# Patient Record
Sex: Male | Born: 1935 | Race: White | Hispanic: No | Marital: Married | Smoking: Former smoker
Health system: Southern US, Community
[De-identification: ages and names within clinical notes are randomized; demographics above are authoritative.]

## PROBLEM LIST (undated history)

## (undated) DIAGNOSIS — J189 Pneumonia, unspecified organism: Secondary | ICD-10-CM

## (undated) DIAGNOSIS — H9190 Unspecified hearing loss, unspecified ear: Secondary | ICD-10-CM

## (undated) DIAGNOSIS — E119 Type 2 diabetes mellitus without complications: Secondary | ICD-10-CM

## (undated) DIAGNOSIS — G4730 Sleep apnea, unspecified: Secondary | ICD-10-CM

## (undated) DIAGNOSIS — N400 Enlarged prostate without lower urinary tract symptoms: Secondary | ICD-10-CM

## (undated) DIAGNOSIS — F419 Anxiety disorder, unspecified: Secondary | ICD-10-CM

## (undated) DIAGNOSIS — F329 Major depressive disorder, single episode, unspecified: Secondary | ICD-10-CM

## (undated) DIAGNOSIS — F32A Depression, unspecified: Secondary | ICD-10-CM

## (undated) DIAGNOSIS — E7800 Pure hypercholesterolemia, unspecified: Secondary | ICD-10-CM

## (undated) DIAGNOSIS — I10 Essential (primary) hypertension: Secondary | ICD-10-CM

## (undated) DIAGNOSIS — C801 Malignant (primary) neoplasm, unspecified: Secondary | ICD-10-CM

## (undated) DIAGNOSIS — Z91018 Allergy to other foods: Secondary | ICD-10-CM

## (undated) HISTORY — DX: Allergy to other foods: Z91.018

## (undated) HISTORY — PX: VASECTOMY: SHX75

## (undated) HISTORY — PX: EYE SURGERY: SHX253

## (undated) HISTORY — DX: Sleep apnea, unspecified: G47.30

## (undated) HISTORY — DX: Pneumonia, unspecified organism: J18.9

## (undated) HISTORY — PX: TONSILLECTOMY: SUR1361

---

## 1997-05-22 ENCOUNTER — Other Ambulatory Visit: Admission: RE | Admit: 1997-05-22 | Discharge: 1997-05-22 | Payer: Self-pay | Admitting: Family Medicine

## 2002-11-23 ENCOUNTER — Ambulatory Visit (HOSPITAL_COMMUNITY): Admission: RE | Admit: 2002-11-23 | Discharge: 2002-11-23 | Payer: Self-pay | Admitting: Gastroenterology

## 2004-04-27 ENCOUNTER — Emergency Department (HOSPITAL_COMMUNITY): Admission: EM | Admit: 2004-04-27 | Discharge: 2004-04-28 | Payer: Self-pay | Admitting: Emergency Medicine

## 2012-06-08 ENCOUNTER — Encounter (HOSPITAL_COMMUNITY): Payer: Self-pay | Admitting: Emergency Medicine

## 2012-06-08 ENCOUNTER — Emergency Department (HOSPITAL_COMMUNITY)
Admission: EM | Admit: 2012-06-08 | Discharge: 2012-06-08 | Disposition: A | Discharge status: Home / Self Care | Payer: Medicare Other | Attending: Emergency Medicine | Admitting: Emergency Medicine

## 2012-06-08 ENCOUNTER — Emergency Department (HOSPITAL_COMMUNITY): Payer: Medicare Other

## 2012-06-08 DIAGNOSIS — T7840XA Allergy, unspecified, initial encounter: Secondary | ICD-10-CM

## 2012-06-08 DIAGNOSIS — R1084 Generalized abdominal pain: Secondary | ICD-10-CM | POA: Insufficient documentation

## 2012-06-08 DIAGNOSIS — R61 Generalized hyperhidrosis: Secondary | ICD-10-CM | POA: Insufficient documentation

## 2012-06-08 DIAGNOSIS — E119 Type 2 diabetes mellitus without complications: Secondary | ICD-10-CM | POA: Insufficient documentation

## 2012-06-08 DIAGNOSIS — R109 Unspecified abdominal pain: Secondary | ICD-10-CM

## 2012-06-08 DIAGNOSIS — Z87891 Personal history of nicotine dependence: Secondary | ICD-10-CM | POA: Insufficient documentation

## 2012-06-08 HISTORY — DX: Type 2 diabetes mellitus without complications: E11.9

## 2012-06-08 LAB — CBC WITH DIFFERENTIAL/PLATELET
Basophils Absolute: 0 10*3/uL (ref 0.0–0.1)
Basophils Relative: 0 % (ref 0–1)
Eosinophils Absolute: 0.1 10*3/uL (ref 0.0–0.7)
Eosinophils Relative: 1 % (ref 0–5)
HCT: 40.9 % (ref 39.0–52.0)
Hemoglobin: 14.3 g/dL (ref 13.0–17.0)
Lymphocytes Relative: 10 % — ABNORMAL LOW (ref 12–46)
Lymphs Abs: 1.3 10*3/uL (ref 0.7–4.0)
MCH: 28.2 pg (ref 26.0–34.0)
MCHC: 35 g/dL (ref 30.0–36.0)
MCV: 80.7 fL (ref 78.0–100.0)
Monocytes Absolute: 0.5 10*3/uL (ref 0.1–1.0)
Monocytes Relative: 4 % (ref 3–12)
Neutro Abs: 11.2 10*3/uL — ABNORMAL HIGH (ref 1.7–7.7)
Neutrophils Relative %: 86 % — ABNORMAL HIGH (ref 43–77)
Platelets: 164 10*3/uL (ref 150–400)
RBC: 5.07 MIL/uL (ref 4.22–5.81)
RDW: 12.6 % (ref 11.5–15.5)
WBC: 13 10*3/uL — ABNORMAL HIGH (ref 4.0–10.5)

## 2012-06-08 LAB — COMPREHENSIVE METABOLIC PANEL
ALT: 14 U/L (ref 0–53)
AST: 13 U/L (ref 0–37)
Albumin: 3.5 g/dL (ref 3.5–5.2)
Alkaline Phosphatase: 62 U/L (ref 39–117)
BUN: 18 mg/dL (ref 6–23)
CO2: 26 mEq/L (ref 19–32)
Calcium: 8.9 mg/dL (ref 8.4–10.5)
Chloride: 100 mEq/L (ref 96–112)
Creatinine, Ser: 0.9 mg/dL (ref 0.50–1.35)
GFR calc Af Amer: >90 mL/min (ref >90)
GFR calc non Af Amer: 81 mL/min — ABNORMAL LOW (ref >90)
Glucose, Bld: 334 mg/dL — ABNORMAL HIGH (ref 70–99)
Potassium: 3.8 mEq/L (ref 3.5–5.1)
Sodium: 137 mEq/L (ref 135–145)
Total Bilirubin: 0.3 mg/dL (ref 0.3–1.2)
Total Protein: 6.1 g/dL (ref 6.0–8.3)

## 2012-06-08 LAB — TROPONIN I
Troponin I: <0.3 ng/mL (ref <0.30)
Troponin I: <0.3 ng/mL (ref <0.30)

## 2012-06-08 LAB — LIPASE, BLOOD: Lipase: 41 U/L (ref 11–59)

## 2012-06-08 MED ORDER — SODIUM CHLORIDE 0.9 % IV BOLUS (SEPSIS)
1000.0000 mL | Freq: Once | INTRAVENOUS | Status: AC
  Administered 2012-06-08: 1000 mL via INTRAVENOUS

## 2012-06-08 MED ORDER — DIPHENHYDRAMINE HCL 50 MG/ML IJ SOLN
25.0000 mg | Freq: Once | INTRAMUSCULAR | Status: AC
  Administered 2012-06-08: 25 mg via INTRAVENOUS
  Filled 2012-06-08: qty 1

## 2012-06-08 MED ORDER — EPINEPHRINE 0.3 MG/0.3ML IJ SOAJ: 0.3000 mg | Freq: Once | INTRAMUSCULAR | Status: DC

## 2012-06-08 NOTE — ED Notes (Signed)
ZOX:[id number]<AV> Expected date:<BR> Expected time:<BR> Means of arrival:<BR> Comments:<BR> Abdominal pain

## 2012-06-08 NOTE — ED Provider Notes (Signed)
History     CSN: [id number]  Arrival date & time 06/08/12  1743   First MD Initiated Contact with Patient 06/08/12 1752      Chief Complaint  Patient presents with  . Abdominal Pain    (Consider location/radiation/quality/duration/timing/severity/associated sxs/prior treatment) The history is provided by the patient.  Nathaniel Gray is a 77 y.o. male history of diabetes, who presenting with abdominal pain. He ate working for lunch and arouse later had some abdominal pain. He was trying to have a bowel movement and then had diffuse abdominal pain he also became flushed and he felt that his throat was closing off. She also reported diaphoresis at the time but denies any chest pain or vomiting. He thought he was having an allergic reaction but didn't take any medicine or unusual food prior to that. No cardiac history. He went to urgent care was given sublingual nitroglycerin and GI cocktail and sent here for evaluation. His CBG at that time was 415.    Past Medical History  Diagnosis Date  . Diabetes mellitus without complication     Past Surgical History  Procedure Laterality Date  . Vasectomy      No family history on file.  History  Substance Use Topics  . Smoking status: Former Smoker -- 2.00 packs/day for 35 years    Types: Cigarettes    Quit date: 01/19/1989  . Smokeless tobacco: Never Used  . Alcohol Use: Yes     Comment: Occassionally       Review of Systems  Gastrointestinal: Positive for abdominal pain.  All other systems reviewed and are negative.    Allergies  Review of patient's allergies indicates no known allergies.  Home Medications  No current outpatient prescriptions on file.  BP 145/66  Pulse 87  Temp(Src) 98.8 F (37.1 C) (Oral)  Resp 18  SpO2 97%  Physical Exam  Nursing note and vitals reviewed. Constitutional: He is oriented to person, place, and time. He appears well-developed and well-nourished.  Comfortable   HENT:  Head:  Normocephalic.  Mouth/Throat: Oropharynx is clear and moist.  OP not swollen, uvula midline   Eyes: Conjunctivae are normal. Pupils are equal, round, and reactive to light.  Neck: Normal range of motion. Neck supple.  Cardiovascular: Normal rate, regular rhythm and normal heart sounds.   Pulmonary/Chest: Effort normal and breath sounds normal. No respiratory distress. He has no wheezes. He has no rales.  Abdominal: Soft. Bowel sounds are normal.  Obese, nontender abdomen   Musculoskeletal: Normal range of motion.  Neurological: He is alert and oriented to person, place, and time.  Skin: Skin is warm and dry. No erythema.  Psychiatric: He has a normal mood and affect. His behavior is normal. Judgment and thought content normal.    ED Course  Procedures (including critical care time)  Labs Reviewed  CBC WITH DIFFERENTIAL - Abnormal; Notable for the following:    WBC 13.0 (*)    Neutrophils Relative % 86 (*)    Neutro Abs 11.2 (*)    Lymphocytes Relative 10 (*)    All other components within normal limits  COMPREHENSIVE METABOLIC PANEL - Abnormal; Notable for the following:    Glucose, Bld 334 (*)    GFR calc non Af Amer 81 (*)    All other components within normal limits  LIPASE, BLOOD  TROPONIN I  TROPONIN I   Dg Abd Acute W/chest  06/08/2012   *RADIOLOGY REPORT*  Clinical Data: Shortness of breath, abdominal pain,  nausea/vomiting  ACUTE ABDOMEN SERIES (ABDOMEN 2 VIEW & CHEST 1 VIEW)  Comparison: None.  Findings: Mild patchy bibasilar opacities, likely atelectasis. No pleural effusion or pneumothorax.  Mild cardiomegaly.  Nonobstructive bowel gas pattern.  No evidence of free air under the diaphragm on the upright view.  Surgical clips in the left mid abdomen.  Degenerative changes of the visualized thoracolumbar spine.  IMPRESSION: Mild patchy bibasilar opacities, likely atelectasis.  No evidence of small bowel obstruction or free air.   Original Report Authenticated By: Charline Bills, M.D.     No diagnosis found.   Date: 06/08/2012  Rate: 81  Rhythm: normal sinus rhythm  QRS Axis: normal  Intervals: normal  ST/T Wave abnormalities: nonspecific ST changes  Conduction Disutrbances:left anterior fascicular block  Narrative Interpretation:   Old EKG Reviewed: none available    MDM  Nathaniel Gray is a 77 y.o. male here with ab pain, diaphoresis. No obvious rash to suggest allergic reaction. Consider vagal response vs atypical ACS. Will get labs, trop x 2.   10:30 PM Trop neg x 2 low risk for ACS. Xray ab with chest unremarkable. Throat not swelling up again. Tolerated PO well. CBG 334 but no gap. I recommend prn benadryl. No need for steroids for now (it will exacerbate his elevated blood sugar). Will give script for epi pen in case it happens again. Recommend outpatient allergy testing.        Richardean Canal, MD 06/08/12 2232

## 2012-06-08 NOTE — ED Notes (Addendum)
Per EMS: Pt c/o abdominal pain. Pt was at work and walked to a urgent care. Pt reports trying to have a bowel movement and reports becoming flush and "his throat was closing off." Urgent care could not obtain a blood pressure and gave 0.4 mg of nitro sublingual and a GI cocktail. Urgent care started a 22g IV was given 700 ml of D5W. Upon EMS arrival pt denied feelings of his throat closing off or shortness of breath, however still reported abdominal pain and nausea, CBG was 415.

## 2014-05-11 ENCOUNTER — Other Ambulatory Visit: Payer: Self-pay | Admitting: Family Medicine

## 2014-05-11 ENCOUNTER — Ambulatory Visit
Admission: RE | Admit: 2014-05-11 | Discharge: 2014-05-11 | Disposition: A | Discharge status: Home / Self Care | Payer: Self-pay | Source: Ambulatory Visit | Attending: Family Medicine | Admitting: Family Medicine

## 2014-05-11 DIAGNOSIS — M79604 Pain in right leg: Secondary | ICD-10-CM

## 2014-05-11 DIAGNOSIS — M79672 Pain in left foot: Principal | ICD-10-CM

## 2014-05-11 DIAGNOSIS — M79605 Pain in left leg: Principal | ICD-10-CM

## 2014-05-11 DIAGNOSIS — M79671 Pain in right foot: Principal | ICD-10-CM

## 2015-01-08 ENCOUNTER — Other Ambulatory Visit: Payer: Self-pay | Admitting: Neurosurgery

## 2015-01-22 DIAGNOSIS — E7800 Pure hypercholesterolemia, unspecified: Secondary | ICD-10-CM | POA: Diagnosis not present

## 2015-01-22 DIAGNOSIS — E1129 Type 2 diabetes mellitus with other diabetic kidney complication: Secondary | ICD-10-CM | POA: Diagnosis not present

## 2015-01-22 DIAGNOSIS — E538 Deficiency of other specified B group vitamins: Secondary | ICD-10-CM | POA: Diagnosis not present

## 2015-01-24 DIAGNOSIS — E538 Deficiency of other specified B group vitamins: Secondary | ICD-10-CM | POA: Diagnosis not present

## 2015-01-24 DIAGNOSIS — I10 Essential (primary) hypertension: Secondary | ICD-10-CM | POA: Diagnosis not present

## 2015-01-24 DIAGNOSIS — E1129 Type 2 diabetes mellitus with other diabetic kidney complication: Secondary | ICD-10-CM | POA: Diagnosis not present

## 2015-01-24 DIAGNOSIS — E7800 Pure hypercholesterolemia, unspecified: Secondary | ICD-10-CM | POA: Diagnosis not present

## 2015-01-24 DIAGNOSIS — E669 Obesity, unspecified: Secondary | ICD-10-CM | POA: Diagnosis not present

## 2015-02-07 DIAGNOSIS — E162 Hypoglycemia, unspecified: Secondary | ICD-10-CM | POA: Diagnosis not present

## 2015-02-07 DIAGNOSIS — M5116 Intervertebral disc disorders with radiculopathy, lumbar region: Secondary | ICD-10-CM | POA: Diagnosis not present

## 2015-02-13 DIAGNOSIS — H35371 Puckering of macula, right eye: Secondary | ICD-10-CM | POA: Diagnosis not present

## 2015-02-13 DIAGNOSIS — H547 Unspecified visual loss: Secondary | ICD-10-CM | POA: Diagnosis not present

## 2015-02-21 DIAGNOSIS — H35371 Puckering of macula, right eye: Secondary | ICD-10-CM | POA: Diagnosis not present

## 2015-02-25 ENCOUNTER — Encounter (HOSPITAL_COMMUNITY)
Admission: RE | Admit: 2015-02-25 | Discharge: 2015-02-25 | Disposition: A | Discharge status: Home / Self Care | Payer: Medicare Other | Source: Ambulatory Visit | Attending: Neurosurgery | Admitting: Neurosurgery

## 2015-02-25 ENCOUNTER — Encounter (HOSPITAL_COMMUNITY): Payer: Self-pay

## 2015-02-25 DIAGNOSIS — Z01812 Encounter for preprocedural laboratory examination: Secondary | ICD-10-CM | POA: Diagnosis not present

## 2015-02-25 DIAGNOSIS — R9431 Abnormal electrocardiogram [ECG] [EKG]: Secondary | ICD-10-CM | POA: Diagnosis not present

## 2015-02-25 DIAGNOSIS — M4806 Spinal stenosis, lumbar region: Secondary | ICD-10-CM | POA: Insufficient documentation

## 2015-02-25 DIAGNOSIS — Z01818 Encounter for other preprocedural examination: Secondary | ICD-10-CM | POA: Diagnosis not present

## 2015-02-25 HISTORY — DX: Major depressive disorder, single episode, unspecified: F32.9

## 2015-02-25 HISTORY — DX: Benign prostatic hyperplasia without lower urinary tract symptoms: N40.0

## 2015-02-25 HISTORY — DX: Unspecified hearing loss, unspecified ear: H91.90

## 2015-02-25 HISTORY — DX: Pure hypercholesterolemia, unspecified: E78.00

## 2015-02-25 HISTORY — DX: Malignant (primary) neoplasm, unspecified: C80.1

## 2015-02-25 HISTORY — DX: Essential (primary) hypertension: I10

## 2015-02-25 HISTORY — DX: Depression, unspecified: F32.A

## 2015-02-25 HISTORY — DX: Anxiety disorder, unspecified: F41.9

## 2015-02-25 LAB — CBC
HCT: 39.7 % (ref 39.0–52.0)
Hemoglobin: 13.7 g/dL (ref 13.0–17.0)
MCH: 29 pg (ref 26.0–34.0)
MCHC: 34.5 g/dL (ref 30.0–36.0)
MCV: 83.9 fL (ref 78.0–100.0)
Platelets: 150 10*3/uL (ref 150–400)
RBC: 4.73 MIL/uL (ref 4.22–5.81)
RDW: 13 % (ref 11.5–15.5)
WBC: 6.3 10*3/uL (ref 4.0–10.5)

## 2015-02-25 LAB — BASIC METABOLIC PANEL
Anion gap: 13 (ref 5–15)
BUN: 20 mg/dL (ref 6–20)
CO2: 27 mmol/L (ref 22–32)
Calcium: 9.6 mg/dL (ref 8.9–10.3)
Chloride: 102 mmol/L (ref 101–111)
Creatinine, Ser: 1.15 mg/dL (ref 0.61–1.24)
GFR calc Af Amer: >60 mL/min (ref >60)
GFR calc non Af Amer: 60 mL/min — ABNORMAL LOW (ref >60)
Glucose, Bld: 162 mg/dL — ABNORMAL HIGH (ref 65–99)
Potassium: 4.3 mmol/L (ref 3.5–5.1)
Sodium: 142 mmol/L (ref 135–145)

## 2015-02-25 LAB — SURGICAL PCR SCREEN
MRSA, PCR: NEGATIVE
Staphylococcus aureus: NEGATIVE

## 2015-02-25 LAB — GLUCOSE, CAPILLARY: Glucose-Capillary: 175 mg/dL — ABNORMAL HIGH (ref 65–99)

## 2015-02-25 NOTE — Progress Notes (Addendum)
Patient states that his am sugars range between 100-130.  Have instructed him to only take 52 units of lantus the night before. NO diabetic pills that morning. PCP is Dr. Geoffry Paradise  @ [facility].  They did an HGB A1C in January -- 8.2.  They are sending me a copy of his labs. Denies every seeing a cardiologist and is not having any cardiac issues.

## 2015-02-25 NOTE — Pre-Procedure Instructions (Signed)
Nathaniel Gray  02/25/2015      [facility] [address], [place] - [address] [address] [address] [place] [address] Phone: [phone] Fax: [phone]    Your procedure is scheduled on Monday, February 13th   Report to [facility] Admitting at 9:15 AM             (Surgery time is posted 11:23 am - 2:15 pm)   Call this number if you have problems the morning of surgery:  [phone] How to Manage Your Diabetes Before Surgery   Why is it important to control my blood sugar before and after surgery?   Improving blood sugar levels before and after surgery helps healing and can limit problems.  A way of improving blood sugar control is eating a healthy diet by:  - Eating less sugar and carbohydrates  - Increasing activity/exercise  - Talk with your doctor about reaching your blood sugar goals  High blood sugars (greater than 180 mg/dL) can raise your risk of infections and slow down your recovery so you will need to focus on controlling your diabetes during the weeks before surgery.  Make sure that the doctor who takes care of your diabetes knows about your planned surgery including the date and location.  How do I manage my blood sugars before surgery?   Check your blood sugar at least 4 times a day, 2 days before surgery to make sure that they are not too high or low.   Check your blood sugar the morning of your surgery when you wake up and every 2               hours until you get to the Short-Stay unit.  If your blood sugar is less than 70 mg/dL, you will need to treat for low blood sugar by:  Treat a low blood sugar (less than 70 mg/dL) with 1/2 cup of clear juice (cranberry or apple), 4 glucose tablets, OR glucose gel.  Recheck blood sugar in 15 minutes after treatment (to make sure it is greater than 70 mg/dL).  If blood sugar is not greater than 70 mg/dL on re-check, call [phone] for further instructions.   Report your blood  sugar to the Short-Stay nurse when you get to Short-Stay.  References:  [facility], 2007 "How to Manage your Diabetes Before and After Surgery".  What do I do about my diabetes medications?   Do not take oral diabetes medicines (pills) the morning of surgery. (NO amaryl/metformin)    THE NIGHT BEFORE SURGERY, take 52 units of  Lantus Insulin.      Remember:  Do not eat food or drink liquids after midnight Sunday.  Take these medicines the morning of surgery with A SIP OF WATER : Xanax, Wellbutrin, Flomax    Do not wear jewelry - no rings or watches.  Do not wear lotions, powders, or perfumes.  You may NOT  wear deodorant the day of surgery.             Men may shave face and neck.  Do not bring valuables to the hospital.  [facility] is not responsible for any belongings or valuables.  Contacts, dentures or bridgework may not be worn into surgery.  Leave your suitcase in the car.  After surgery it may be brought to your room.  For patients admitted to the hospital, discharge time will be determined by your treatment  team.  Patients discharged the day of surgery will not be allowed to drive home.   Name and phone number of your driver:     Please read over the following fact sheets that you were given. Pain Booklet, Coughing and Deep Breathing, MRSA Information and Surgical Site Infection Prevention

## 2015-03-01 MED ORDER — CEFAZOLIN SODIUM-DEXTROSE 2-3 GM-% IV SOLR
2.0000 g | INTRAVENOUS | Status: AC
  Administered 2015-03-04: 2 g via INTRAVENOUS
  Filled 2015-03-01: qty 50

## 2015-03-04 ENCOUNTER — Inpatient Hospital Stay (HOSPITAL_COMMUNITY): Payer: Medicare Other | Admitting: Anesthesiology

## 2015-03-04 ENCOUNTER — Encounter (HOSPITAL_COMMUNITY)
Admission: RE | Disposition: A | Discharge status: Home / Self Care | Payer: Self-pay | Source: Ambulatory Visit | Attending: Neurosurgery

## 2015-03-04 ENCOUNTER — Inpatient Hospital Stay (HOSPITAL_COMMUNITY): Payer: Medicare Other

## 2015-03-04 ENCOUNTER — Encounter (HOSPITAL_COMMUNITY): Payer: Self-pay | Admitting: Certified Registered Nurse Anesthetist

## 2015-03-04 ENCOUNTER — Ambulatory Visit (HOSPITAL_COMMUNITY)
Admission: RE | Admit: 2015-03-04 | Discharge: 2015-03-05 | Disposition: A | Discharge status: Home / Self Care | Payer: Medicare Other | Source: Ambulatory Visit | Attending: Neurosurgery | Admitting: Neurosurgery

## 2015-03-04 DIAGNOSIS — F329 Major depressive disorder, single episode, unspecified: Secondary | ICD-10-CM | POA: Insufficient documentation

## 2015-03-04 DIAGNOSIS — E7800 Pure hypercholesterolemia, unspecified: Secondary | ICD-10-CM | POA: Diagnosis not present

## 2015-03-04 DIAGNOSIS — M47816 Spondylosis without myelopathy or radiculopathy, lumbar region: Secondary | ICD-10-CM | POA: Diagnosis not present

## 2015-03-04 DIAGNOSIS — M5136 Other intervertebral disc degeneration, lumbar region: Secondary | ICD-10-CM | POA: Diagnosis not present

## 2015-03-04 DIAGNOSIS — M48062 Spinal stenosis, lumbar region with neurogenic claudication: Secondary | ICD-10-CM | POA: Diagnosis present

## 2015-03-04 DIAGNOSIS — Z79899 Other long term (current) drug therapy: Secondary | ICD-10-CM | POA: Diagnosis not present

## 2015-03-04 DIAGNOSIS — Z8546 Personal history of malignant neoplasm of prostate: Secondary | ICD-10-CM | POA: Insufficient documentation

## 2015-03-04 DIAGNOSIS — I10 Essential (primary) hypertension: Secondary | ICD-10-CM | POA: Diagnosis not present

## 2015-03-04 DIAGNOSIS — E119 Type 2 diabetes mellitus without complications: Secondary | ICD-10-CM | POA: Insufficient documentation

## 2015-03-04 DIAGNOSIS — E669 Obesity, unspecified: Secondary | ICD-10-CM | POA: Diagnosis not present

## 2015-03-04 DIAGNOSIS — Z87891 Personal history of nicotine dependence: Secondary | ICD-10-CM | POA: Insufficient documentation

## 2015-03-04 DIAGNOSIS — Z419 Encounter for procedure for purposes other than remedying health state, unspecified: Secondary | ICD-10-CM

## 2015-03-04 DIAGNOSIS — Z794 Long term (current) use of insulin: Secondary | ICD-10-CM | POA: Diagnosis not present

## 2015-03-04 DIAGNOSIS — Z7982 Long term (current) use of aspirin: Secondary | ICD-10-CM | POA: Diagnosis not present

## 2015-03-04 DIAGNOSIS — N400 Enlarged prostate without lower urinary tract symptoms: Secondary | ICD-10-CM | POA: Diagnosis not present

## 2015-03-04 DIAGNOSIS — M4806 Spinal stenosis, lumbar region: Secondary | ICD-10-CM | POA: Diagnosis not present

## 2015-03-04 DIAGNOSIS — M4326 Fusion of spine, lumbar region: Secondary | ICD-10-CM | POA: Diagnosis not present

## 2015-03-04 DIAGNOSIS — F419 Anxiety disorder, unspecified: Secondary | ICD-10-CM | POA: Insufficient documentation

## 2015-03-04 DIAGNOSIS — Z6837 Body mass index (BMI) 37.0-37.9, adult: Secondary | ICD-10-CM | POA: Diagnosis not present

## 2015-03-04 HISTORY — PX: LUMBAR LAMINECTOMY/DECOMPRESSION MICRODISCECTOMY: SHX5026

## 2015-03-04 LAB — GLUCOSE, CAPILLARY
Glucose-Capillary: 133 mg/dL — ABNORMAL HIGH (ref 65–99)
Glucose-Capillary: 124 mg/dL — ABNORMAL HIGH (ref 65–99)
Glucose-Capillary: 89 mg/dL (ref 65–99)

## 2015-03-04 SURGERY — LUMBAR LAMINECTOMY/DECOMPRESSION MICRODISCECTOMY 3 LEVELS
Anesthesia: General | Site: Back

## 2015-03-04 MED ORDER — OFLOXACIN 0.3 % OP SOLN
1.0000 [drp] | Freq: Four times a day (QID) | OPHTHALMIC | Status: DC
  Administered 2015-03-04 (×2): 1 [drp] via OPHTHALMIC
  Filled 2015-03-04: qty 5

## 2015-03-04 MED ORDER — ONDANSETRON HCL 4 MG/2ML IJ SOLN: 4.0000 mg | Freq: Once | INTRAMUSCULAR | Status: DC | PRN

## 2015-03-04 MED ORDER — PROPOFOL 10 MG/ML IV BOLUS
INTRAVENOUS | Status: DC | PRN
  Administered 2015-03-04: 130 mg via INTRAVENOUS

## 2015-03-04 MED ORDER — OXYCODONE-ACETAMINOPHEN 5-325 MG PO TABS
1.0000 | ORAL_TABLET | ORAL | Status: DC | PRN
  Administered 2015-03-05: 2 via ORAL
  Filled 2015-03-04: qty 2

## 2015-03-04 MED ORDER — FENTANYL CITRATE (PF) 100 MCG/2ML IJ SOLN
INTRAMUSCULAR | Status: AC
  Filled 2015-03-04: qty 2

## 2015-03-04 MED ORDER — SODIUM CHLORIDE 0.9 % IR SOLN
Status: DC | PRN
  Administered 2015-03-04 (×2)

## 2015-03-04 MED ORDER — BISACODYL 10 MG RE SUPP: 10.0000 mg | Freq: Every day | RECTAL | Status: DC | PRN

## 2015-03-04 MED ORDER — HEMOSTATIC AGENTS (NO CHARGE) OPTIME
TOPICAL | Status: DC | PRN
  Administered 2015-03-04: 1 via TOPICAL

## 2015-03-04 MED ORDER — LIDOCAINE-EPINEPHRINE 1 %-1:100000 IJ SOLN
INTRAMUSCULAR | Status: DC | PRN
  Administered 2015-03-04: 15 mL

## 2015-03-04 MED ORDER — HYDROCHLOROTHIAZIDE 25 MG PO TABS: 25.0000 mg | ORAL_TABLET | Freq: Every day | ORAL | Status: DC

## 2015-03-04 MED ORDER — PHENYLEPHRINE HCL 10 MG/ML IJ SOLN
10.0000 mg | INTRAMUSCULAR | Status: DC | PRN
  Administered 2015-03-04: 15 ug/min via INTRAVENOUS

## 2015-03-04 MED ORDER — ONDANSETRON HCL 4 MG/2ML IJ SOLN
INTRAMUSCULAR | Status: AC
  Filled 2015-03-04: qty 2

## 2015-03-04 MED ORDER — INSULIN ASPART 100 UNIT/ML ~~LOC~~ SOLN: 0.0000 [IU] | Freq: Every day | SUBCUTANEOUS | Status: DC

## 2015-03-04 MED ORDER — ACETAMINOPHEN 650 MG RE SUPP: 650.0000 mg | RECTAL | Status: DC | PRN

## 2015-03-04 MED ORDER — SODIUM CHLORIDE 0.9% FLUSH: 3.0000 mL | INTRAVENOUS | Status: DC | PRN

## 2015-03-04 MED ORDER — PROPOFOL 10 MG/ML IV BOLUS
INTRAVENOUS | Status: AC
  Filled 2015-03-04: qty 20

## 2015-03-04 MED ORDER — SODIUM CHLORIDE 0.9% FLUSH
3.0000 mL | Freq: Two times a day (BID) | INTRAVENOUS | Status: DC
  Administered 2015-03-04: 3 mL via INTRAVENOUS

## 2015-03-04 MED ORDER — LOSARTAN POTASSIUM-HCTZ 100-25 MG PO TABS: 1.0000 | ORAL_TABLET | Freq: Every day | ORAL | Status: DC

## 2015-03-04 MED ORDER — LACTATED RINGERS IV SOLN
INTRAVENOUS | Status: DC
Start: 2015-03-04 — End: 2015-03-05
  Administered 2015-03-04: 10:00:00 via INTRAVENOUS

## 2015-03-04 MED ORDER — 0.9 % SODIUM CHLORIDE (POUR BTL) OPTIME
TOPICAL | Status: DC | PRN
  Administered 2015-03-04 (×2): 1000 mL

## 2015-03-04 MED ORDER — HYDROCODONE-ACETAMINOPHEN 5-325 MG PO TABS: 1.0000 | ORAL_TABLET | ORAL | Status: DC | PRN

## 2015-03-04 MED ORDER — ONDANSETRON HCL 4 MG/2ML IJ SOLN
INTRAMUSCULAR | Status: DC | PRN
  Administered 2015-03-04: 4 mg via INTRAVENOUS

## 2015-03-04 MED ORDER — FENTANYL CITRATE (PF) 250 MCG/5ML IJ SOLN
INTRAMUSCULAR | Status: AC
  Filled 2015-03-04: qty 5

## 2015-03-04 MED ORDER — KETOROLAC TROMETHAMINE 30 MG/ML IJ SOLN
INTRAMUSCULAR | Status: AC
  Filled 2015-03-04: qty 1

## 2015-03-04 MED ORDER — NEOSTIGMINE METHYLSULFATE 10 MG/10ML IV SOLN
INTRAVENOUS | Status: DC | PRN
  Administered 2015-03-04: 5 mg via INTRAVENOUS

## 2015-03-04 MED ORDER — TAMSULOSIN HCL 0.4 MG PO CAPS
0.4000 mg | ORAL_CAPSULE | Freq: Every day | ORAL | Status: DC
  Administered 2015-03-05: 0.4 mg via ORAL
  Filled 2015-03-04: qty 1

## 2015-03-04 MED ORDER — ALUM & MAG HYDROXIDE-SIMETH 200-200-20 MG/5ML PO SUSP
30.0000 mL | Freq: Four times a day (QID) | ORAL | Status: DC | PRN
Start: 2015-03-04 — End: 2015-03-05

## 2015-03-04 MED ORDER — LACTATED RINGERS IV SOLN
INTRAVENOUS | Status: DC | PRN
  Administered 2015-03-04 (×3): via INTRAVENOUS

## 2015-03-04 MED ORDER — HYDROXYZINE HCL 50 MG/ML IM SOLN: 50.0000 mg | INTRAMUSCULAR | Status: DC | PRN

## 2015-03-04 MED ORDER — EPHEDRINE SULFATE 50 MG/ML IJ SOLN
INTRAMUSCULAR | Status: DC | PRN
  Administered 2015-03-04 (×10): 5 mg via INTRAVENOUS

## 2015-03-04 MED ORDER — FENTANYL CITRATE (PF) 100 MCG/2ML IJ SOLN
INTRAMUSCULAR | Status: DC | PRN
  Administered 2015-03-04: 100 ug via INTRAVENOUS

## 2015-03-04 MED ORDER — ROCURONIUM BROMIDE 50 MG/5ML IV SOLN
INTRAVENOUS | Status: AC
  Filled 2015-03-04: qty 1

## 2015-03-04 MED ORDER — PHENOL 1.4 % MT LIQD: 1.0000 | OROMUCOSAL | Status: DC | PRN

## 2015-03-04 MED ORDER — ACETAMINOPHEN 10 MG/ML IV SOLN
INTRAVENOUS | Status: AC
  Administered 2015-03-04: 1000 mg via INTRAVENOUS
  Filled 2015-03-04: qty 100

## 2015-03-04 MED ORDER — ALBUMIN HUMAN 5 % IV SOLN
INTRAVENOUS | Status: DC | PRN
  Administered 2015-03-04 (×2): via INTRAVENOUS

## 2015-03-04 MED ORDER — MORPHINE SULFATE (PF) 4 MG/ML IV SOLN: 4.0000 mg | INTRAVENOUS | Status: DC | PRN

## 2015-03-04 MED ORDER — LOSARTAN POTASSIUM 50 MG PO TABS: 100.0000 mg | ORAL_TABLET | Freq: Every day | ORAL | Status: DC

## 2015-03-04 MED ORDER — GLYCOPYRROLATE 0.2 MG/ML IJ SOLN
INTRAMUSCULAR | Status: DC | PRN
  Administered 2015-03-04: .8 mg via INTRAVENOUS

## 2015-03-04 MED ORDER — SUCCINYLCHOLINE CHLORIDE 20 MG/ML IJ SOLN
INTRAMUSCULAR | Status: DC | PRN
  Administered 2015-03-04: 100 mg via INTRAVENOUS

## 2015-03-04 MED ORDER — INSULIN ASPART 100 UNIT/ML ~~LOC~~ SOLN: 0.0000 [IU] | Freq: Three times a day (TID) | SUBCUTANEOUS | Status: DC

## 2015-03-04 MED ORDER — ALPRAZOLAM 0.5 MG PO TABS
0.5000 mg | ORAL_TABLET | Freq: Every day | ORAL | Status: DC | PRN
  Administered 2015-03-04 – 2015-03-05 (×2): 0.5 mg via ORAL
  Filled 2015-03-04 (×2): qty 1

## 2015-03-04 MED ORDER — DIFLUPREDNATE 0.05 % OP EMUL: 1.0000 [drp] | Freq: Every day | OPHTHALMIC | Status: DC

## 2015-03-04 MED ORDER — CYCLOBENZAPRINE HCL 10 MG PO TABS
10.0000 mg | ORAL_TABLET | Freq: Three times a day (TID) | ORAL | Status: DC | PRN
  Administered 2015-03-04: 10 mg via ORAL
  Filled 2015-03-04: qty 1

## 2015-03-04 MED ORDER — ROCURONIUM BROMIDE 100 MG/10ML IV SOLN
INTRAVENOUS | Status: DC | PRN
  Administered 2015-03-04: 10 mg via INTRAVENOUS
  Administered 2015-03-04: 40 mg via INTRAVENOUS
  Administered 2015-03-04: 10 mg via INTRAVENOUS

## 2015-03-04 MED ORDER — LIDOCAINE HCL (CARDIAC) 20 MG/ML IV SOLN
INTRAVENOUS | Status: AC
  Filled 2015-03-04: qty 5

## 2015-03-04 MED ORDER — KETOROLAC TROMETHAMINE 30 MG/ML IJ SOLN
30.0000 mg | Freq: Once | INTRAMUSCULAR | Status: AC
  Administered 2015-03-04: 30 mg via INTRAVENOUS

## 2015-03-04 MED ORDER — HYDROXYZINE HCL 25 MG PO TABS: 50.0000 mg | ORAL_TABLET | ORAL | Status: DC | PRN

## 2015-03-04 MED ORDER — ONDANSETRON HCL 4 MG/2ML IJ SOLN: 4.0000 mg | Freq: Four times a day (QID) | INTRAMUSCULAR | Status: DC | PRN

## 2015-03-04 MED ORDER — FENTANYL CITRATE (PF) 100 MCG/2ML IJ SOLN
25.0000 ug | INTRAMUSCULAR | Status: DC | PRN
  Administered 2015-03-04: 25 ug via INTRAVENOUS

## 2015-03-04 MED ORDER — KETOROLAC TROMETHAMINE 30 MG/ML IJ SOLN
30.0000 mg | Freq: Four times a day (QID) | INTRAMUSCULAR | Status: DC
  Administered 2015-03-04 – 2015-03-05 (×2): 30 mg via INTRAVENOUS
  Filled 2015-03-04 (×2): qty 1

## 2015-03-04 MED ORDER — ZOLPIDEM TARTRATE 5 MG PO TABS: 5.0000 mg | ORAL_TABLET | Freq: Every evening | ORAL | Status: DC | PRN

## 2015-03-04 MED ORDER — ACETAMINOPHEN 325 MG PO TABS: 650.0000 mg | ORAL_TABLET | ORAL | Status: DC | PRN

## 2015-03-04 MED ORDER — METFORMIN HCL 500 MG PO TABS
1000.0000 mg | ORAL_TABLET | Freq: Two times a day (BID) | ORAL | Status: DC
  Administered 2015-03-04: 1000 mg via ORAL
  Filled 2015-03-04: qty 2

## 2015-03-04 MED ORDER — SODIUM CHLORIDE 0.9 % IV SOLN: 250.0000 mL | INTRAVENOUS | Status: DC

## 2015-03-04 MED ORDER — BUPIVACAINE HCL (PF) 0.5 % IJ SOLN
INTRAMUSCULAR | Status: DC | PRN
  Administered 2015-03-04: 15 mL

## 2015-03-04 MED ORDER — POTASSIUM CHLORIDE IN NACL 20-0.9 MEQ/L-% IV SOLN
INTRAVENOUS | Status: DC
  Filled 2015-03-04 (×5): qty 1000

## 2015-03-04 MED ORDER — ONDANSETRON HCL 4 MG PO TABS: 4.0000 mg | ORAL_TABLET | Freq: Four times a day (QID) | ORAL | Status: DC | PRN

## 2015-03-04 MED ORDER — MAGNESIUM HYDROXIDE 400 MG/5ML PO SUSP: 30.0000 mL | Freq: Every day | ORAL | Status: DC | PRN

## 2015-03-04 MED ORDER — GLIMEPIRIDE 4 MG PO TABS
4.0000 mg | ORAL_TABLET | Freq: Two times a day (BID) | ORAL | Status: DC
  Administered 2015-03-04: 4 mg via ORAL
  Filled 2015-03-04 (×3): qty 1

## 2015-03-04 MED ORDER — BUPROPION HCL ER (XL) 300 MG PO TB24
300.0000 mg | ORAL_TABLET | Freq: Every day | ORAL | Status: DC
  Filled 2015-03-04: qty 1

## 2015-03-04 MED ORDER — INSULIN GLARGINE 100 UNIT/ML ~~LOC~~ SOLN
66.0000 [IU] | Freq: Every day | SUBCUTANEOUS | Status: DC
  Administered 2015-03-04: 66 [IU] via SUBCUTANEOUS
  Filled 2015-03-04 (×4): qty 0.66

## 2015-03-04 MED ORDER — FINASTERIDE 5 MG PO TABS
5.0000 mg | ORAL_TABLET | Freq: Every day | ORAL | Status: DC
  Administered 2015-03-04: 5 mg via ORAL
  Filled 2015-03-04 (×2): qty 1

## 2015-03-04 MED ORDER — MENTHOL 3 MG MT LOZG
1.0000 | LOZENGE | OROMUCOSAL | Status: DC | PRN
Start: 2015-03-04 — End: 2015-03-05

## 2015-03-04 SURGICAL SUPPLY — 61 items
BAG DECANTER FOR FLEXI CONT (MISCELLANEOUS) ×4 IMPLANT
BENZOIN TINCTURE PRP APPL 2/3 (GAUZE/BANDAGES/DRESSINGS) IMPLANT
BLADE CLIPPER SURG (BLADE) IMPLANT
BRUSH SCRUB EZ PLAIN DRY (MISCELLANEOUS) ×2 IMPLANT
BUR ACORN 6.0 ACORN (BURR) IMPLANT
BUR ACRON 5.0MM COATED (BURR) ×4 IMPLANT
BUR MATCHSTICK NEURO 3.0 LAGG (BURR) ×2 IMPLANT
CANISTER SUCT 3000ML PPV (MISCELLANEOUS) ×2 IMPLANT
DERMABOND ADHESIVE PROPEN (GAUZE/BANDAGES/DRESSINGS) ×1
DERMABOND ADVANCED (GAUZE/BANDAGES/DRESSINGS) ×1
DERMABOND ADVANCED .7 DNX12 (GAUZE/BANDAGES/DRESSINGS) ×1 IMPLANT
DERMABOND ADVANCED .7 DNX6 (GAUZE/BANDAGES/DRESSINGS) ×1 IMPLANT
DRAPE LAPAROTOMY 100X72X124 (DRAPES) ×2 IMPLANT
DRAPE MICROSCOPE LEICA (MISCELLANEOUS) IMPLANT
DRAPE POUCH INSTRU U-SHP 10X18 (DRAPES) ×2 IMPLANT
DRSG EMULSION OIL 3X3 NADH (GAUZE/BANDAGES/DRESSINGS) IMPLANT
ELECT BLADE 4.0 EZ CLEAN MEGAD (MISCELLANEOUS) ×2
ELECT REM PT RETURN 9FT ADLT (ELECTROSURGICAL) ×2
ELECTRODE BLDE 4.0 EZ CLN MEGD (MISCELLANEOUS) ×1 IMPLANT
ELECTRODE REM PT RTRN 9FT ADLT (ELECTROSURGICAL) ×1 IMPLANT
GAUZE SPONGE 4X4 12PLY STRL (GAUZE/BANDAGES/DRESSINGS) ×2 IMPLANT
GAUZE SPONGE 4X4 16PLY XRAY LF (GAUZE/BANDAGES/DRESSINGS) ×2 IMPLANT
GLOVE BIOGEL PI IND STRL 8 (GLOVE) ×1 IMPLANT
GLOVE BIOGEL PI INDICATOR 8 (GLOVE) ×1
GLOVE ECLIPSE 7.5 STRL STRAW (GLOVE) ×2 IMPLANT
GLOVE EXAM NITRILE LRG STRL (GLOVE) IMPLANT
GLOVE EXAM NITRILE MD LF STRL (GLOVE) IMPLANT
GLOVE EXAM NITRILE XL STR (GLOVE) IMPLANT
GLOVE EXAM NITRILE XS STR PU (GLOVE) IMPLANT
GOWN STRL REUS W/ TWL LRG LVL3 (GOWN DISPOSABLE) ×3 IMPLANT
GOWN STRL REUS W/ TWL XL LVL3 (GOWN DISPOSABLE) ×1 IMPLANT
GOWN STRL REUS W/TWL 2XL LVL3 (GOWN DISPOSABLE) IMPLANT
GOWN STRL REUS W/TWL LRG LVL3 (GOWN DISPOSABLE) ×3
GOWN STRL REUS W/TWL XL LVL3 (GOWN DISPOSABLE) ×1
HEMOSTAT POWDER KIT SURGIFOAM (HEMOSTASIS) IMPLANT
HEMOSTAT SURGICEL 2X14 (HEMOSTASIS) ×2 IMPLANT
KIT BASIN OR (CUSTOM PROCEDURE TRAY) ×2 IMPLANT
KIT ROOM TURNOVER OR (KITS) ×2 IMPLANT
NEEDLE HYPO 18GX1.5 BLUNT FILL (NEEDLE) IMPLANT
NEEDLE SPNL 18GX3.5 QUINCKE PK (NEEDLE) ×4 IMPLANT
NEEDLE SPNL 22GX3.5 QUINCKE BK (NEEDLE) ×2 IMPLANT
NS IRRIG 1000ML POUR BTL (IV SOLUTION) ×2 IMPLANT
PACK LAMINECTOMY NEURO (CUSTOM PROCEDURE TRAY) ×2 IMPLANT
PAD ARMBOARD 7.5X6 YLW CONV (MISCELLANEOUS) ×6 IMPLANT
PATTIES SURGICAL .5 X1 (DISPOSABLE) IMPLANT
RUBBERBAND STERILE (MISCELLANEOUS) IMPLANT
SPONGE LAP 4X18 X RAY DECT (DISPOSABLE) IMPLANT
SPONGE NEURO XRAY DETECT 1X3 (DISPOSABLE) ×2 IMPLANT
SPONGE SURGIFOAM ABS GEL 100 (HEMOSTASIS) IMPLANT
STRIP CLOSURE SKIN 1/2X4 (GAUZE/BANDAGES/DRESSINGS) IMPLANT
SUT PROLENE 6 0 BV (SUTURE) IMPLANT
SUT VIC AB 1 CT1 18XBRD ANBCTR (SUTURE) ×4 IMPLANT
SUT VIC AB 1 CT1 8-18 (SUTURE) ×4
SUT VIC AB 2-0 CP2 18 (SUTURE) ×6 IMPLANT
SUT VIC AB 3-0 SH 8-18 (SUTURE) ×2 IMPLANT
SYR 5ML LL (SYRINGE) IMPLANT
TAPE CLOTH SURG 4X10 WHT LF (GAUZE/BANDAGES/DRESSINGS) ×2 IMPLANT
TOWEL OR 17X24 6PK STRL BLUE (TOWEL DISPOSABLE) ×2 IMPLANT
TOWEL OR 17X26 10 PK STRL BLUE (TOWEL DISPOSABLE) ×2 IMPLANT
TRAY FOLEY CATH 16FRSI W/METER (SET/KITS/TRAYS/PACK) ×2 IMPLANT
WATER STERILE IRR 1000ML POUR (IV SOLUTION) ×2 IMPLANT

## 2015-03-04 NOTE — Anesthesia Preprocedure Evaluation (Addendum)
Anesthesia Evaluation  Patient identified by MRN, date of birth, ID band Patient awake    Reviewed: Allergy & Precautions, NPO status , Patient's Chart, lab work & pertinent test results  History of Anesthesia Complications Negative for: history of anesthetic complications  Airway Mallampati: II  TM Distance: >3 FB     Dental no notable dental hx. (+) Dental Advisory Given, Missing, Poor Dentition   Pulmonary former smoker,    Pulmonary exam normal breath sounds clear to auscultation       Cardiovascular hypertension, Pt. on medications Normal cardiovascular exam Rhythm:Regular Rate:Normal     Neuro/Psych PSYCHIATRIC DISORDERS Anxiety Depression negative neurological ROS     GI/Hepatic negative GI ROS, Neg liver ROS,   Endo/Other  diabetes, Type 2, Insulin Dependentobesity  Renal/GU negative Renal ROS  negative genitourinary   Musculoskeletal negative musculoskeletal ROS (+)   Abdominal   Peds negative pediatric ROS (+)  Hematology negative hematology ROS (+)   Anesthesia Other Findings   Reproductive/Obstetrics negative OB ROS                           Anesthesia Physical Anesthesia Plan  ASA: III  Anesthesia Plan: General   Post-op Pain Management:    Induction: Intravenous  Airway Management Planned: Oral ETT  Additional Equipment:   Intra-op Plan:   Post-operative Plan: Extubation in OR  Informed Consent: I have reviewed the patients History and Physical, chart, labs and discussed the procedure including the risks, benefits and alternatives for the proposed anesthesia with the patient or authorized representative who has indicated his/her understanding and acceptance.   Dental advisory given  Plan Discussed with: CRNA  Anesthesia Plan Comments:         Anesthesia Quick Evaluation

## 2015-03-04 NOTE — Op Note (Signed)
03/04/2015  2:03 PM  PATIENT:  Nathaniel Gray  80 y.o. male  PRE-OPERATIVE DIAGNOSIS:  lumbar stenosis with neurogenic claudication, lumbar spondylosis, lumbar degenerative disc disease  POST-OPERATIVE DIAGNOSIS:  lumbar stenosis with neurogenic claudication, lumbar spondylosis, lumbar degenerative disc disease  PROCEDURE:  Procedure(s):  L2, L3, L4, and L5 decompressive lumbar laminectomy and medial facetectomy with foraminotomies for the exiting L2, L3, L4, and L5 nerve roots bilaterally, for decompression of the multilevel spinal canal stenosis and stenotic compression of the exiting nerve root  SURGEON:  Surgeon(s): Jovita Gamma, MD  ASSISTANTS: Cyndy Freeze, M.D.  ANESTHESIA:   general  EBL:  Total I/O In: [id number] [I.V.:2100; IV Piggyback:500] Out: 250 [Urine:150; Blood:100]  BLOOD ADMINISTERED:none  COUNT: Correct per nursing staff  DICTATION: Patient was brought to the operating room placed under general endotracheal anesthesia. Patient was turned to a prone position the lumbar region was prepped with Betadine soap and solution and draped in a sterile fashion. The midline was infiltrated with local anesthetic with epinephrine. A localizing x-ray was taken and then a midline incision was made carried down thru the subcutaneous tissue, bipolar cautery and electrocautery were used to maintain hemostasis. Dissection was carried down to the lumbar fascia which was incised bilaterally and the paraspinal muscles were dissected from the spinous process and lamina in a subperiosteal fashion. Another localizing x-ray was taken and the L2-L5 levels were identified. Laminectomy was begun with double-action rongeurs a high-speed drill and Kerrison punches. The thickened ligamentum flavum was carefully removed. Dissection was carried laterally, performing medial facetectomies bilaterally to decompress the lateral stenosis taking care to leave the facet complexes intact. Foraminotomies were  performed for the exiting L2, L3, L4, and L5 nerve roots, so as to decompress their stenotic compression. Once the decompression was completed hemostasis was established with the use of Surgicel in the epidural space. Paraspinal muscles, deep fascia, and Scarpa's fascia were closed in separate layers with interrupted 1 undyed Vicryl sutures. The subcutaneous and subcuticular were closed with interrupted inverted 2-0 undyed Vicryl sutures. Skin edges were approximated with Dermabond. A dressing of sterile gauze and Hypafix was applied. Following surgery the patient was returned back to a supine position, reversed from the anesthetic, extubated, and transferred to the recovery room for further care.  PLAN OF CARE: Admit for overnight observation  PATIENT DISPOSITION:  PACU - hemodynamically stable.   Delay start of Pharmacological VTE agent (>24hrs) due to surgical blood loss or risk of bleeding:  yes

## 2015-03-04 NOTE — Transfer of Care (Signed)
Immediate Anesthesia Transfer of Care Note  Patient: Nathaniel Gray  Procedure(s) Performed: Procedure(s): Lumbar two-Lumbar five decompressive lumbar laminectomies (N/A)  Patient Location: PACU  Anesthesia Type:General  Level of Consciousness: awake, alert , patient cooperative and responds to stimulation  Airway & Oxygen Therapy: Patient Spontanous Breathing and Patient connected to nasal cannula oxygen  Post-op Assessment: Report given to RN, Post -op Vital signs reviewed and stable and Patient moving all extremities X 4  Post vital signs: Reviewed and stable  Last Vitals:  Filed Vitals:   03/04/15 0945  BP: 145/46  Pulse: 63  Temp: 36.1 C  Resp: 20    Complications: No apparent anesthesia complications

## 2015-03-04 NOTE — Anesthesia Postprocedure Evaluation (Signed)
Anesthesia Post Note  Patient: Nathaniel Gray  Procedure(s) Performed: Procedure(s) (LRB): Lumbar two-Lumbar five decompressive lumbar laminectomies (N/A)  Patient location during evaluation: PACU Anesthesia Type: General Level of consciousness: awake and alert Pain management: pain level controlled Vital Signs Assessment: post-procedure vital signs reviewed and stable Respiratory status: spontaneous breathing, nonlabored ventilation, respiratory function stable and patient connected to nasal cannula oxygen Cardiovascular status: blood pressure returned to baseline and stable Postop Assessment: no signs of nausea or vomiting Anesthetic complications: no    Last Vitals:  Filed Vitals:   03/04/15 1447 03/04/15 1502  BP: 137/90 135/67  Pulse: 79 78  Temp:    Resp: 10 7    Last Pain:  Filed Vitals:   03/04/15 1518  PainSc: 6                  Trinten Boudoin JENNETTE

## 2015-03-04 NOTE — H&P (Signed)
Subjective: Patient is a 80 y.o. right-handed white male who is admitted for treatment of multilevel, multifactorial lumbar stenosis.  Patient's had increasing difficulties of the past year and a half with low back pain radiating down through the lower extremities. Symptoms are typically brought on by standing and walking, and relieved by sitting. MRI scan shows marked to severe multifactorial lumbar stenosis at the L2-3, L3-4, L4-5 levels. He is now admitted for an L2-L5 decompressive lumbar laminectomy.    Past Medical History  Diagnosis Date  . Hypertension   . High cholesterol   . Diabetes mellitus without complication ([facility])     dx 1991  . Anxiety   . BPH (benign prostatic hyperplasia)   . Cancer [facility])     prostate --  borderline...only takes meds   psa was elevated  . HOH (hard of hearing)   . Depression     Past Surgical History  Procedure Laterality Date  . Vasectomy    . Tonsillectomy    . Eye surgery      cataracts bilateral.   macular pucker    Prescriptions prior to admission  Medication Sig Dispense Refill Last Dose  . acetaminophen (TYLENOL) 500 MG tablet Take 1,000 mg by mouth daily as needed for mild pain.   Past Week at Unknown time  . ALPRAZolam (XANAX) 0.5 MG tablet Take 0.5 mg by mouth daily as needed for anxiety.   03/04/2015 at 0810  . aspirin EC 81 MG tablet Take 81 mg by mouth daily.   Past Week at Unknown time  . buPROPion (WELLBUTRIN XL) 300 MG 24 hr tablet Take 300 mg by mouth daily.   03/04/2015 at 0730  . cyanocobalamin 1000 MCG tablet Take 2,500 mcg by mouth daily.   Past Week at Unknown time  . Difluprednate (DUREZOL) 0.05 % EMUL Place 1 drop into the right eye at bedtime.   03/03/2015 at Unknown time  . finasteride (PROSCAR) 5 MG tablet Take 5 mg by mouth daily.   03/03/2015  . glimepiride (AMARYL) 4 MG tablet Take 4 mg by mouth 2 (two) times daily.  0 03/03/2015 at Unknown time  . LANTUS 100 UNIT/ML injection Inject 66 Units into the skin at bedtime.   10  03/03/2015 at Unknown time  . losartan-hydrochlorothiazide (HYZAAR) 100-25 MG tablet Take 1 tablet by mouth daily.   03/03/2015 at Unknown time  . metFORMIN (GLUCOPHAGE) 1000 MG tablet Take 1,000 mg by mouth 2 (two) times daily with a meal.   03/03/2015 at Unknown time  . ofloxacin (OCUFLOX) 0.3 % ophthalmic solution Place 1 drop into the right eye 4 (four) times daily.   03/03/2015 at Unknown time  . Omega-3 Fatty Acids (FISH OIL) 1200 MG CAPS Take 1,200 mg by mouth 2 (two) times daily.   Past Week at Unknown time  . tamsulosin (FLOMAX) 0.4 MG CAPS capsule Take 0.4 mg by mouth daily.  1 03/04/2015 at 0810  . EPINEPHrine (EPIPEN) 0.3 mg/0.3 mL DEVI Inject 0.3 mLs (0.3 mg total) into the muscle once. 1 Device 1 More than a month at Unknown time   Allergies  Allergen Reactions  . Beef-Derived Products Anaphylaxis  . Lactose Intolerance (Gi) Anaphylaxis and Diarrhea    Gas  . Pork-Derived Products Anaphylaxis    Social History  Substance Use Topics  . Smoking status: Former Smoker -- 2.00 packs/day for 35 years    Types: Cigarettes    Quit date: 01/19/1989  . Smokeless tobacco: Never Used  . Alcohol  Use: Yes     Comment: Occassionally     History reviewed. No pertinent family history.   Review of Systems A comprehensive review of systems was negative.  Objective: Vital signs in last 24 hours: Temp:  [96.9 F (36.1 C)] 96.9 F (36.1 C) (02/13 0945) Pulse Rate:  [63] 63 (02/13 0945) Resp:  [20] 20 (02/13 0945) BP: (145)/(46) 145/46 mmHg (02/13 0945) SpO2:  [100 %] 100 % (02/13 0945) Weight:  [117.028 kg (258 lb)] 117.028 kg (258 lb) (02/13 0945)  EXAM: Patient well-developed well-nourished white male in no acute distress. Lungs are clear to auscultation , the patient has symmetrical respiratory excursion. Heart has a regular rate and rhythm normal S1 and S2 no murmur.   Abdomen is soft nontender nondistended bowel sounds are present. Extremity examination shows no clubbing cyanosis or  edema. Motor examination shows 5 over 5 strength in the lower extremities including the iliopsoas quadriceps dorsiflexor extensor hallicus  longus and plantar flexor bilaterally. Sensation is intact to pinprick in the distal lower extremities. Reflexes are symmetrical bilaterally. No pathologic reflexes are present. Patient has a normal gait and stance.   Data Review:CBC    Component Value Date/Time   WBC 6.3 02/25/2015 1140   RBC 4.73 02/25/2015 1140   HGB 13.7 02/25/2015 1140   HCT 39.7 02/25/2015 1140   PLT 150 02/25/2015 1140   MCV 83.9 02/25/2015 1140   MCH 29.0 02/25/2015 1140   MCHC 34.5 02/25/2015 1140   RDW 13.0 02/25/2015 1140   LYMPHSABS 1.3 06/08/2012 1830   MONOABS 0.5 06/08/2012 1830   EOSABS 0.1 06/08/2012 1830   BASOSABS 0.0 06/08/2012 1830                          BMET    Component Value Date/Time   NA 142 02/25/2015 1140   K 4.3 02/25/2015 1140   CL 102 02/25/2015 1140   CO2 27 02/25/2015 1140   GLUCOSE 162* 02/25/2015 1140   BUN 20 02/25/2015 1140   CREATININE 1.15 02/25/2015 1140   CALCIUM 9.6 02/25/2015 1140   GFRNONAA 60* 02/25/2015 1140   GFRAA >60 02/25/2015 1140     Assessment/Plan: Patient with multilevel, multifactorial lumbar stenosis, admitted for an L2-L5 decompressive lumbar laminectomy.  I've discussed with the patient the nature of his condition, the nature the surgical procedure, the typical length of surgery, hospital stay, and overall recuperation. We discussed limitations postoperatively. I discussed risks of surgery including risks of infection, bleeding, possibly need for transfusion, the risk of nerve root dysfunction with pain, weakness, numbness, or paresthesias, or risk of dural tear and CSF leakage and possible need for further surgery, and the risk of anesthetic complications including myocardial infarction, stroke, pneumonia, and death. Understanding all this the patient does wish to proceed with surgery and is admitted for  such.    Hosie Spangle, MD 03/04/2015 9:51 AM

## 2015-03-04 NOTE — Progress Notes (Signed)
Filed Vitals:   03/04/15 1545 03/04/15 1547 03/04/15 1549 03/04/15 1614  BP:  146/64  165/67  Pulse:  78  82  Temp:   98 F (36.7 C) 97.7 F (36.5 C)  TempSrc:      Resp: 10 8  18   Height:      Weight:      SpO2:  100%  100%    Patient resting comfortably in bed. Ambulated from stretcher to bed. Foley DC'd in PACU, no void yet. Nursing staff to monitor voiding function. Dressing clean and dry.  Plan: Encouraged to ambulate in the halls. Continue to progress through postoperative recovery.  Hosie Spangle, MD 03/04/2015, 5:28 PM

## 2015-03-04 NOTE — Anesthesia Procedure Notes (Addendum)
Procedure Name: Intubation Date/Time: 03/04/2015 10:43 AM Performed by: Tressia Miners LEFFEW Pre-anesthesia Checklist: Patient identified, Patient being monitored, Timeout performed, Emergency Drugs available and Suction available Patient Re-evaluated:Patient Re-evaluated prior to inductionOxygen Delivery Method: Circle System Utilized Preoxygenation: Pre-oxygenation with 100% oxygen Intubation Type: IV induction Ventilation: Oral airway inserted - appropriate to patient size and Two handed mask ventilation required Laryngoscope Size: Mac and 4 Grade View: Grade II Tube type: Oral Tube size: 7.5 mm Number of attempts: 1 Airway Equipment and Method: Stylet Placement Confirmation: ETT inserted through vocal cords under direct vision,  positive ETCO2 and breath sounds checked- equal and bilateral Secured at: 22 cm Tube secured with: Tape Dental Injury: Teeth and Oropharynx as per pre-operative assessment  Comments: Intubated by Imogene Burn MD. DL x 1 by CRNA but grade III view and difficult to pass bougie. MDA took over and grade II view, ETT passed easily with stylet.

## 2015-03-05 ENCOUNTER — Encounter (HOSPITAL_COMMUNITY): Payer: Self-pay | Admitting: Neurosurgery

## 2015-03-05 DIAGNOSIS — Z794 Long term (current) use of insulin: Secondary | ICD-10-CM | POA: Diagnosis not present

## 2015-03-05 DIAGNOSIS — M47816 Spondylosis without myelopathy or radiculopathy, lumbar region: Secondary | ICD-10-CM | POA: Diagnosis not present

## 2015-03-05 DIAGNOSIS — Z7982 Long term (current) use of aspirin: Secondary | ICD-10-CM | POA: Diagnosis not present

## 2015-03-05 DIAGNOSIS — N400 Enlarged prostate without lower urinary tract symptoms: Secondary | ICD-10-CM | POA: Diagnosis not present

## 2015-03-05 DIAGNOSIS — E7800 Pure hypercholesterolemia, unspecified: Secondary | ICD-10-CM | POA: Diagnosis not present

## 2015-03-05 DIAGNOSIS — M4806 Spinal stenosis, lumbar region: Secondary | ICD-10-CM | POA: Diagnosis not present

## 2015-03-05 DIAGNOSIS — Z87891 Personal history of nicotine dependence: Secondary | ICD-10-CM | POA: Diagnosis not present

## 2015-03-05 DIAGNOSIS — Z79899 Other long term (current) drug therapy: Secondary | ICD-10-CM | POA: Diagnosis not present

## 2015-03-05 DIAGNOSIS — I10 Essential (primary) hypertension: Secondary | ICD-10-CM | POA: Diagnosis not present

## 2015-03-05 DIAGNOSIS — M5136 Other intervertebral disc degeneration, lumbar region: Secondary | ICD-10-CM | POA: Diagnosis not present

## 2015-03-05 DIAGNOSIS — E119 Type 2 diabetes mellitus without complications: Secondary | ICD-10-CM | POA: Diagnosis not present

## 2015-03-05 LAB — GLUCOSE, CAPILLARY
Glucose-Capillary: 165 mg/dL — ABNORMAL HIGH (ref 65–99)
Glucose-Capillary: 77 mg/dL (ref 65–99)

## 2015-03-05 MED ORDER — HYDROCODONE-ACETAMINOPHEN 5-325 MG PO TABS: 1.0000 | ORAL_TABLET | ORAL | Status: DC | PRN

## 2015-03-05 NOTE — Discharge Instructions (Signed)
Wound Care Leave incision open to air. You may shower. Do not scrub directly on incision.  Do not put any creams, lotions, or ointments on incision. Activity Walk each and every day, increasing distance each day. No lifting greater than 5 lbs.  Avoid bending, arching, and twisting. No driving for 2 weeks; may ride as a passenger locally. If provided with back brace, wear when out of bed.  It is not necessary to wear in bed. Diet Resume your normal diet.  Return to Work Will be discussed at you follow up appointment. Call Your Doctor If Any of These Occur Redness, drainage, or swelling at the wound.  Temperature greater than 101 degrees. Severe pain not relieved by pain medication. Incision starts to come apart. Follow Up Appt Call today for appointment in 3 weeks [id number]) or for problems.  If you have any hardware placed in your spine, you will need an x-ray before your appointment.   Foley Catheter Care, Adult A Foley catheter is a soft, flexible tube that is placed into the bladder to drain urine. A Foley catheter may be inserted if:  You leak urine or are not able to control when you urinate (urinary incontinence).  You are not able to urinate when you need to (urinary retention).  You had prostate surgery or surgery on the genitals.  You have certain medical conditions, such as multiple sclerosis, dementia, or a spinal cord injury. If you are going home with a Foley catheter in place, follow the instructions below. TAKING CARE OF THE CATHETER  Wash your hands with soap and water.  Using mild soap and warm water on a clean washcloth:  Clean the area on your body closest to the catheter insertion site using a circular motion, moving away from the catheter. Never wipe toward the catheter because this could sweep bacteria up into the urethra and cause infection.  Remove all traces of soap. Pat the area dry with a clean towel. For males, reposition the foreskin.  Attach  the catheter to your leg so there is no tension on the catheter. Use adhesive tape or a leg strap. If you are using adhesive tape, remove any sticky residue left behind by the previous tape you used.  Keep the drainage bag below the level of the bladder, but keep it off the floor.  Check throughout the day to be sure the catheter is working and urine is draining freely. Make sure the tubing does not become kinked.  Do not pull on the catheter or try to remove it. Pulling could damage internal tissues. TAKING CARE OF THE DRAINAGE BAGS You will be given two drainage bags to take home. One is a large overnight drainage bag, and the other is a smaller leg bag that fits underneath clothing. You may wear the overnight bag at any time, but you should never wear the smaller leg bag at night. Follow the instructions below for how to empty, change, and clean your drainage bags. Emptying the Drainage Bag You must empty your drainage bag when it is  - full or at least 2-3 times a day.  Wash your hands with soap and water.  Keep the drainage bag below your hips, below the level of your bladder. This stops urine from going back into the tubing and into your bladder.  Hold the dirty bag over the toilet or a clean container.  Open the pour spout at the bottom of the bag and empty the urine into the toilet or  container. Do not let the pour spout touch the toilet, container, or any other surface. Doing so can place bacteria on the bag, which can cause an infection.  Clean the pour spout with a gauze pad or cotton ball that has rubbing alcohol on it.  Close the pour spout.  Attach the bag to your leg with adhesive tape or a leg strap.  Wash your hands well. Changing the Drainage Bag Change your drainage bag once a month or sooner if it starts to smell bad or look dirty. Below are steps to follow when changing the drainage bag.  Wash your hands with soap and water.  Pinch off the rubber catheter so that  urine does not spill out.  Disconnect the catheter tube from the drainage tube at the connection valve. Do not let the tubes touch any surface.  Clean the end of the catheter tube with an alcohol wipe. Use a different alcohol wipe to clean the end of the drainage tube.  Connect the catheter tube to the drainage tube of the clean drainage bag.  Attach the new bag to the leg with adhesive tape or a leg strap. Avoid attaching the new bag too tightly.  Wash your hands well. Cleaning the Drainage Bag  Wash your hands with soap and water.  Wash the bag in warm, soapy water.  Rinse the bag thoroughly with warm water.  Fill the bag with a solution of white vinegar and water (1 cup vinegar to 1 qt warm water [.2 L vinegar to 1 L warm water]). Close the bag and soak it for 30 minutes in the solution.  Rinse the bag with warm water.  Hang the bag to dry with the pour spout open and hanging downward.  Store the clean bag (once it is dry) in a clean plastic bag.  Wash your hands well. PREVENTING INFECTION  Wash your hands before and after handling your catheter.  Take showers daily and wash the area where the catheter enters your body. Do not take baths. Replace wet leg straps with dry ones, if this applies.  Do not use powders, sprays, or lotions on the genital area. Only use creams, lotions, or ointments as directed by your caregiver.  For females, wipe from front to back after each bowel movement.  Drink enough fluids to keep your urine clear or pale yellow unless you have a fluid restriction.  Do not let the drainage bag or tubing touch or lie on the floor.  Wear cotton underwear to absorb moisture and to keep your skin drier. SEEK MEDICAL CARE IF:   Your urine is cloudy or smells unusually bad.  Your catheter becomes clogged.  You are not draining urine into the bag or your bladder feels full.  Your catheter starts to leak. SEEK IMMEDIATE MEDICAL CARE IF:   You have  pain, swelling, redness, or pus where the catheter enters the body.  You have pain in the abdomen, legs, lower back, or bladder.  You have a fever.  You see blood fill the catheter, or your urine is pink or red.  You have nausea, vomiting, or chills.  Your catheter gets pulled out. MAKE SURE YOU:   Understand these instructions.  Will watch your condition.  Will get help right away if you are not doing well or get worse.   This information is not intended to replace advice given to you by your health care provider. Make sure you discuss any questions you have with your  health care provider.   Document Released: 01/05/2005 Document Revised: 05/22/2013 Document Reviewed: 12/28/2011 Elsevier Interactive Patient Education 2016 [place].   Laminectomy During a laminectomy, small pieces of bone in the spine called lamina are removed. The ligaments underneath the lamina and parts of the joints that have grown too large are also removed. This takes pressure off the nerves.  LET Nathaniel Gray CARE PROVIDER KNOW ABOUT:  Any allergies you have.  All medicines you are taking, including vitamins, herbs, eye drops, creams, and over-the-counter medicines.  Previous problems you or members of your family have had with the use of anesthetics.  Any blood disorders you have.  Previous surgeries you have had.  Medical conditions you have. RISKS AND COMPLICATIONS  Generally, laminectomy is a safe procedure. However, as with any procedure, complications can occur. Possible complications include:  Infection near the incision.  Nerve damage. Signs of this can be pain, weakness, or numbness.  Leaking of spinal fluid.  Blood clot in a leg. The clot can move to the lungs. This can be very serious.  Bowel or bladder incontinence (rare). BEFORE THE PROCEDURE   You will need to stop taking certain medicines as directed by your health care provider.  If you smoke, stop at least 2 weeks  before the procedure. Smoking can slow down the healing process and increase the risk of complications.  Do not eat or drink anything for at least 8 hours before the procedure. Take any medicines that your health care provider tells you to keep taking with a sip of water.  Do not drink alcohol the day before your surgery.  Tell your health care provider if you develop a cold or any infection before your surgery.  Arrange for someone to drive you home after the procedure or after your Gray stay. Also arrange for someone to help you with activities during recovery. PROCEDURE  Small monitors will be placed on your body. They are used to check your heart, blood pressure, and oxygen level.  An IV tube will be inserted into one of your veins. Medicine will flow directly into your body through the IV tube.  You might be given a sedative. This will help you relax.  You will be given a medicine to make you sleep (general anesthetic), and a breathing tube will be placed into your lungs. During general anesthesia, you are unaware of the procedure and do not feel any pain.  Your back will be cleaned with a special solution to kill germs on your skin.  Once you are asleep, the surgeon will make a 2-inch to 5-inch cut (incision) in your back. The length of the incision will depend on how many spinal bones (vertebrae) are being operated on.  Muscles in the back will be moved away from the vertebrae and pulled to the side.  Pieces of lamina will be removed.  The ligament that lies under the lamina and connects your vertebrae will be removed.  Enough ligaments and thickened joints will be removed to take pressure off your nerves.  Your nerves will be identified, and their passage will be tracked and assessed for excessive tightness.  Your back muscles will be moved back into their normal position.  The area under your skin will be closed with small, absorbable stitches. These stitches do not  need to be removed.  Your skin will be closed with small absorbable stitches or staples.  A dressing will be put over your incision.  The procedure may take  1-3 hours. AFTER THE PROCEDURE   You will stay in a recovery area until the anesthesia has worn off. Your blood pressure and pulse will be checked every so often. Then you will be taken to a Gray room.  You may continue to get fluids through the IV tube for a while.  Some pain is normal. You may be given pain medicine while still in the recovery area.  It is important to be up and moving as soon as possible after a surgery. Physical therapists will help you start walking.  To prevent blood clots in your legs:  You may be given special stockings to wear.  You may need to take medicine to prevent clots.  You may be asked to do special breathing exercises to re-expand your lungs. This is to prevent a lung infection.  Most people stay in the Gray for 1-3 days after a laminectomy.   This information is not intended to replace advice given to you by your health care provider. Make sure you discuss any questions you have with your health care provider.   Document Released: 12/24/2008 Document Revised: 10/26/2012 Document Reviewed: 08/17/2012 Elsevier Interactive Patient Education [employer].

## 2015-03-05 NOTE — Progress Notes (Signed)
Pt and friend given D/C instructions with Rx, verbal understanding was provided. Pt's incision is open to air and has no sign of infection. Pt's IV was removed prior to D/C. Pt D/C'd home with Foley/leg bag. Pt D/C'd home via wheelchair @ 1345 per MD order. Pt is stable @ D/C and has no other needs at this time. Holli Humbles, RN

## 2015-03-05 NOTE — Discharge Summary (Signed)
Physician Discharge Summary  Patient ID: Nathaniel Gray MRN: [id number] DOB/AGE: [birth year] 80 y.o.  Admit date: 03/04/2015 Discharge date: 03/05/2015  Admission Diagnoses:  lumbar stenosis with neurogenic claudication, lumbar spondylosis, lumbar degenerative disc disease  Discharge Diagnoses:  lumbar stenosis with neurogenic claudication, lumbar spondylosis, lumbar degenerative disc disease Active Problems:   Lumbar stenosis with neurogenic claudication   Discharged Condition: good  Hospital Course: Patient was admitted, underwent an L2-L5 decompressive lumbar laminectomy. Postoperatively he has had little in the way of pain. He is up and ambulate actively. However he was unable to void postoperatively, and had a Foley placed at about midnight. He has a history of BPH, and is followed by Dr. Rana Snare at [facility] urology.  He takes Flomax 0.4 milligrams daily.  We discussed the alternatives of a voiding trial later today or follow-up later this week with Dr. Risa Grill, and he elected the latter. He is being sent home with a Foley catheter with a leg bag, and has been instructed to follow-up with Dr. Risa Grill in 2-3 days. He has been given instructions regarding wound care and activities following discharge. He is scheduled to follow-up with me in the office in 3 weeks.   Discharge Exam: Blood pressure 139/60, pulse 81, temperature 98 F (36.7 C), temperature source Oral, resp. rate 20, height 5' 9.5" (1.765 m), weight 117.028 kg (258 lb), SpO2 97 %.  Disposition: 01-Home or Self Care     Medication List    TAKE these medications        acetaminophen 500 MG tablet  Commonly known as:  TYLENOL  Take 1,000 mg by mouth daily as needed for mild pain.     ALPRAZolam 0.5 MG tablet  Commonly known as:  XANAX  Take 0.5 mg by mouth daily as needed for anxiety.     aspirin EC 81 MG tablet  Take 81 mg by mouth daily.     buPROPion 300 MG 24 hr tablet  Commonly known as:  WELLBUTRIN XL   Take 300 mg by mouth daily.     cyanocobalamin 1000 MCG tablet  Take 2,500 mcg by mouth daily.     DUREZOL 0.05 % Emul  Generic drug:  Difluprednate  Place 1 drop into the right eye at bedtime.     EPINEPHrine 0.3 mg/0.3 mL Soaj injection  Commonly known as:  EPIPEN  Inject 0.3 mLs (0.3 mg total) into the muscle once.     finasteride 5 MG tablet  Commonly known as:  PROSCAR  Take 5 mg by mouth daily.     Fish Oil 1200 MG Caps  Take 1,200 mg by mouth 2 (two) times daily.     glimepiride 4 MG tablet  Commonly known as:  AMARYL  Take 4 mg by mouth 2 (two) times daily.     HYDROcodone-acetaminophen 5-325 MG tablet  Commonly known as:  NORCO/VICODIN  Take 1-2 tablets by mouth every 4 (four) hours as needed (mild pain).     LANTUS 100 UNIT/ML injection  Generic drug:  insulin glargine  Inject 66 Units into the skin at bedtime.     losartan-hydrochlorothiazide 100-25 MG tablet  Commonly known as:  HYZAAR  Take 1 tablet by mouth daily.     metFORMIN 1000 MG tablet  Commonly known as:  GLUCOPHAGE  Take 1,000 mg by mouth 2 (two) times daily with a meal.     ofloxacin 0.3 % ophthalmic solution  Commonly known as:  OCUFLOX  Place 1 drop into  the right eye 4 (four) times daily.     tamsulosin 0.4 MG Caps capsule  Commonly known as:  FLOMAX  Take 0.4 mg by mouth daily.         SignedHosie Spangle 03/05/2015, 8:55 AM

## 2015-03-07 DIAGNOSIS — R338 Other retention of urine: Secondary | ICD-10-CM | POA: Diagnosis not present

## 2015-03-18 ENCOUNTER — Encounter: Payer: Self-pay | Admitting: Allergy and Immunology

## 2015-03-18 ENCOUNTER — Ambulatory Visit (INDEPENDENT_AMBULATORY_CARE_PROVIDER_SITE_OTHER): Payer: Medicare Other | Admitting: Allergy and Immunology

## 2015-03-18 VITALS — BP 140/92 | HR 78 | Resp 18

## 2015-03-18 DIAGNOSIS — J309 Allergic rhinitis, unspecified: Secondary | ICD-10-CM | POA: Diagnosis not present

## 2015-03-18 DIAGNOSIS — H6992 Unspecified Eustachian tube disorder, left ear: Secondary | ICD-10-CM

## 2015-03-18 DIAGNOSIS — Z91018 Allergy to other foods: Secondary | ICD-10-CM

## 2015-03-18 DIAGNOSIS — H1010 Acute atopic conjunctivitis, unspecified eye: Secondary | ICD-10-CM | POA: Diagnosis not present

## 2015-03-18 MED ORDER — MONTELUKAST SODIUM 10 MG PO TABS: ORAL_TABLET | ORAL | Status: DC

## 2015-03-18 NOTE — Progress Notes (Signed)
Follow-up Note  Referring Provider: No ref. provider found Primary Provider: Leonides Sake, MD Date of Office Visit: 03/18/2015  Subjective:   Nathaniel Gray. (DOB: [birth year]) is a 80 y.o. 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 He has been avoiding all mammal consumption and has not had any allergic reaction the past two years. He is now eatting dairy products without any problem and is eating peanut products without any problem  Nathaniel Gray has been having problems with his airway. He has nasal congestion and left ear fullness and occasional popping in his left ear and occasional sneezing. His ability to smell is somewhat diminished. This is been going on a long period in time but is only recently that he's been addressing this area of his body. He apparently tried some type of nasal spray that was administered for 1 month earlier this winter which really didn't help him to any large degree. There is no obvious provoking factors giving rise to his problem.   Outpatient Prescriptions Prior to Visit  Medication Sig Dispense Refill  . acetaminophen (TYLENOL) 500 MG tablet Take 1,000 mg by mouth daily as needed for mild pain.    Marland Kitchen ALPRAZolam (XANAX) 0.5 MG tablet Take 0.5 mg by mouth daily as needed for anxiety.    Marland Kitchen aspirin EC 81 MG tablet Take 81 mg by mouth daily.    Marland Kitchen buPROPion (WELLBUTRIN XL) 300 MG 24 hr tablet Take 300 mg by mouth daily.    . cyanocobalamin 1000 MCG tablet Take 2,500 mcg by mouth daily.    . Difluprednate (DUREZOL) 0.05 % EMUL Place 1 drop into the right eye at bedtime.    Marland Kitchen EPINEPHrine (EPIPEN) 0.3 mg/0.3 mL DEVI Inject 0.3 mLs (0.3 mg total) into the muscle once. 1 Device 1  . finasteride (PROSCAR) 5 MG tablet Take 5 mg by mouth daily.    Marland Kitchen glimepiride (AMARYL) 4 MG tablet Take 4 mg by mouth 2 (two) times daily.  0  . LANTUS  100 UNIT/ML injection Inject 66 Units into the skin at bedtime.   10  . losartan-hydrochlorothiazide (HYZAAR) 100-25 MG tablet Take 1 tablet by mouth daily.    . metFORMIN (GLUCOPHAGE) 1000 MG tablet Take 1,000 mg by mouth 2 (two) times daily with a meal.    . ofloxacin (OCUFLOX) 0.3 % ophthalmic solution Place 1 drop into the right eye 4 (four) times daily.    . Omega-3 Fatty Acids (FISH OIL) 1200 MG CAPS Take 1,200 mg by mouth 2 (two) times daily.    . tamsulosin (FLOMAX) 0.4 MG CAPS capsule Take 0.4 mg by mouth daily.  1  . HYDROcodone-acetaminophen (NORCO/VICODIN) 5-325 MG tablet Take 1-2 tablets by mouth every 4 (four) hours as needed (mild pain). 60 tablet 0   No facility-administered medications prior to visit.    Past Medical History  Diagnosis Date  . Hypertension   . High cholesterol   . Diabetes mellitus without complication ([place])     dx 1991  . Anxiety   . BPH (benign prostatic hyperplasia)   . [facility])     prostate --  borderline...only takes meds   psa was elevated  . HOH (hard of hearing)   . Depression     Past Surgical History  Procedure Laterality Date  . Vasectomy    . Tonsillectomy    .  Eye surgery      cataracts bilateral.   macular pucker  . Lumbar laminectomy/decompression microdiscectomy N/A 03/04/2015    Procedure: Lumbar two-Lumbar five decompressive lumbar laminectomies;  Surgeon: Jovita Gamma, MD;  Location: [facility];  Service: Neurosurgery;  Laterality: N/A;    Allergies  Allergen Reactions  . Beef-Derived Products Anaphylaxis  . Lactose Intolerance (Gi) Anaphylaxis and Diarrhea    Gas  . Pork-Derived Products Anaphylaxis    Review of systems negative except as noted in HPI / PMHx or noted below:  Review of Systems  Constitutional: Negative.   HENT: Negative.   Eyes: Negative.   Respiratory: Negative.   Cardiovascular: Negative.   Gastrointestinal: Negative.   Genitourinary: Negative.   Musculoskeletal: Negative.   Skin:  Negative.   Neurological: Negative.        Recent lumbar fusion 2 weeks ago with good response  Endo/Heme/Allergies: Negative.   Psychiatric/Behavioral: Negative.      Objective:   Filed Vitals:   03/18/15 1124  BP: 140/92  Pulse: 78  Resp: 18          Physical Exam  Constitutional: He is well-developed, well-nourished, and in no distress.  HENT:  Head: Normocephalic.  Right Ear: Tympanic membrane, external ear and ear canal normal.  Left Ear: Tympanic membrane, external ear and ear canal normal.  Nose: Mucosal edema present. No rhinorrhea.  Mouth/Throat: Uvula is midline, oropharynx is clear and moist and mucous membranes are normal. No oropharyngeal exudate.  Eyes: Conjunctivae are normal.  Neck: Trachea normal. No tracheal tenderness present. No tracheal deviation present. No thyromegaly present.  Cardiovascular: Normal rate, regular rhythm, S1 normal, S2 normal and normal heart sounds.   No murmur heard. Pulmonary/Chest: Breath sounds normal. No stridor. No respiratory distress. He has no wheezes. He has no rales.  Musculoskeletal: He exhibits no edema.  Lymphadenopathy:       Head (right side): No tonsillar adenopathy present.       Head (left side): No tonsillar adenopathy present.    He has no cervical adenopathy.    He has no axillary adenopathy.  Neurological: He is alert. Gait normal.  Skin: No rash noted. He is not diaphoretic. No erythema. Nails show no clubbing.  Psychiatric: Mood and affect normal.    Diagnostics:   Assessment and Plan:   1. Food allergy   2. Allergic rhinoconjunctivitis   3. Eustachian tube disorder, left     1. Blood - Alpha gal panel, area 2 aeroallergen profile  2. Epi-pen, benadryl, MD/ER for allergic reaction  3. Treat inflammation:   A. OTC Rhinocort one spray each nostril one time per day. Coupon.  B. montelukast 10 mg one tablet one time per day  4. If needed:   A. OTC nasal saline  B. OTC antihistamine -  Zyrtec/Claritin/Allegra   5. Further evaluation?  6. ENT evaluation?  7. Return to clinic in 4 weeks     I will have [place] check a titer against alpha gal to see if he is still a allergic individual against mammal protein and if his titers of her low we'll consider challenging him to mammal this clinic. As well, to work through his upper airway inflammatory condition will check an area 2 aero allergen profile to provide him allergen avoidance measures and I've given him a combination of anti-inflammatory medications for her respiratory tract with Rhinocort and montelukast. If he does not have a good response to this treatment will send him onto an ear nose  and throat physician for further management of his eustation tube dysfunction. I'll regroup with him in 1 month.   Allena Katz, MD [place]

## 2015-03-18 NOTE — Patient Instructions (Addendum)
  1. Blood - Alpha gal panel, area 2 aeroallergen profile  2. Epi-pen, benadryl, MD/ER for allergic reaction  3. Treat inflammation:   A. OTC Rhinocort one spray each nostril one time per day. Coupon.  B. montelukast 10 mg one tablet one time per day  4. If needed:   A. OTC nasal saline  B. OTC antihistamine - Zyrtec/Claritin/Allegra   5. Further evaluation?  6. ENT evaluation?  7. Return to clinic in 4 weeks

## 2015-03-27 LAB — ALLERGENS, ZONE 2
Alternaria Alternata IgE: <0.1 kU/L
Amer Sycamore IgE Qn: <0.1 kU/L
Aspergillus Fumigatus IgE: <0.1 kU/L
Bahia Grass IgE: <0.1 kU/L
Bermuda Grass IgE: <0.1 kU/L
Cat Dander IgE: 0.43 kU/L — AB
Cedar, Mountain IgE: <0.1 kU/L
Cladosporium Herbarum IgE: <0.1 kU/L
Cockroach, American IgE: <0.1 kU/L
Common Silver Birch IgE: <0.1 kU/L
D Farinae IgE: <0.1 kU/L
D Pteronyssinus IgE: <0.1 kU/L
Dog Dander IgE: 0.57 kU/L — AB
Elm, American IgE: <0.1 kU/L
Hickory, White IgE: <0.1 kU/L
Johnson Grass IgE: <0.1 kU/L
Maple/Box Elder IgE: <0.1 kU/L
Mucor Racemosus IgE: <0.1 kU/L
Mugwort IgE Qn: <0.1 kU/L
Nettle IgE: <0.1 kU/L
Oak, White IgE: <0.1 kU/L
Penicillium Chrysogen IgE: <0.1 kU/L
Pigweed, Rough IgE: <0.1 kU/L
Plantain, English IgE: <0.1 kU/L
Ragweed, Short IgE: <0.1 kU/L
Sheep Sorrel IgE Qn: <0.1 kU/L
Stemphylium Herbarum IgE: <0.1 kU/L
Sweet gum IgE RAST Ql: <0.1 kU/L
Timothy Grass IgE: <0.1 kU/L
White Mulberry IgE: <0.1 kU/L

## 2015-03-27 LAB — ALPHA-GAL PANEL
Alpha Gal IgE*: 3.15 kU/L — ABNORMAL HIGH (ref <0.35)
Beef (Bos spp) IgE: 1.97 kU/L — ABNORMAL HIGH (ref <0.35)
Class Interpretation: 2
Class Interpretation: 2
Class Interpretation: 2
Lamb/Mutton (Ovis spp) IgE: 1.34 kU/L — ABNORMAL HIGH (ref <0.35)
Pork (Sus spp) IgE: 1.38 kU/L — ABNORMAL HIGH (ref <0.35)

## 2015-04-02 ENCOUNTER — Telehealth: Payer: Self-pay | Admitting: Allergy and Immunology

## 2015-04-02 NOTE — Telephone Encounter (Signed)
Call Damari at work [phone]

## 2015-04-17 ENCOUNTER — Ambulatory Visit (INDEPENDENT_AMBULATORY_CARE_PROVIDER_SITE_OTHER): Payer: Medicare Other | Admitting: Allergy and Immunology

## 2015-04-17 ENCOUNTER — Encounter: Payer: Self-pay | Admitting: Allergy and Immunology

## 2015-04-17 VITALS — BP 132/80 | HR 74 | Resp 18

## 2015-04-17 DIAGNOSIS — H6992 Unspecified Eustachian tube disorder, left ear: Secondary | ICD-10-CM

## 2015-04-17 DIAGNOSIS — H1010 Acute atopic conjunctivitis, unspecified eye: Secondary | ICD-10-CM | POA: Diagnosis not present

## 2015-04-17 DIAGNOSIS — Z91018 Allergy to other foods: Secondary | ICD-10-CM | POA: Diagnosis not present

## 2015-04-17 DIAGNOSIS — J309 Allergic rhinitis, unspecified: Secondary | ICD-10-CM

## 2015-04-17 MED ORDER — METHYLPREDNISOLONE ACETATE 80 MG/ML IJ SUSP
80.0000 mg | Freq: Once | INTRAMUSCULAR | Status: AC
  Administered 2015-04-17: 80 mg via INTRAMUSCULAR

## 2015-04-17 NOTE — Patient Instructions (Addendum)
  1. Continue mammal consumption avoidance  2. Epi-pen, benadryl, MD/ER for allergic reaction  3. Treat inflammation:   A. OTC Rhinocort one spray each nostril one time per day.  B. montelukast 10 mg one tablet one time per day  C. Depomedrol 80 IM delivered in the clinic today  4. If needed:   A. OTC nasal saline  B. OTC antihistamine - Zyrtec/Claritin/Allegra   5. ENT evaluation for ETD   7. Return to clinic in 1 year or earlier if problem

## 2015-04-17 NOTE — Progress Notes (Signed)
Follow-up Note  Referring Provider: Leonides Sake, MD Primary Provider: Leonides Sake, MD Date of Office Visit: 04/17/2015  Subjective:   Nathaniel Gray. (DOB: Jun 11, 1935) is a 80 y.o. male who returns to the [facility] on 04/17/2015 in re-evaluation of the following:  HPI Comments: HM presents this clinic in evaluation of his alpha gal syndrome and his upper airway and ear issue. As he has been doing for quite a long period in time he has remained away from all mammal consumption and has not had any allergic reactions since doing so. When he was last seen in this clinic about one month ago he did have some persistent upper airway symptoms and left ear fullness for which we gave him anti-inflammatory medications which has not really helped him at all. He still continues to have issues with his ears and issues with his upper airway in the form of congestion.     Medication List           acetaminophen 500 MG tablet  Commonly known as:  TYLENOL  Take 1,000 mg by mouth daily as needed for mild pain.     ALPRAZolam 0.5 MG tablet  Commonly known as:  XANAX  Take 0.5 mg by mouth daily as needed for anxiety.     aspirin EC 81 MG tablet  Take 81 mg by mouth daily.     buPROPion 300 MG 24 hr tablet  Commonly known as:  WELLBUTRIN XL  Take 300 mg by mouth daily.     cyanocobalamin 1000 MCG tablet  Take 2,500 mcg by mouth daily.     DUREZOL 0.05 % Emul  Generic drug:  Difluprednate  Place 1 drop into the right eye at bedtime.     EPINEPHrine 0.3 mg/0.3 mL Soaj injection  Commonly known as:  EPIPEN  Inject 0.3 mLs (0.3 mg total) into the muscle once.     finasteride 5 MG tablet  Commonly known as:  PROSCAR  Take 5 mg by mouth daily.     Fish Oil 1200 MG Caps  Take 1,200 mg by mouth 2 (two) times daily.     glimepiride 4 MG tablet  Commonly known as:  AMARYL  Take 4 mg by mouth 2 (two) times daily.     LANTUS 100 UNIT/ML injection  Generic drug:   insulin glargine  Inject 66 Units into the skin at bedtime.     losartan-hydrochlorothiazide 100-25 MG tablet  Commonly known as:  HYZAAR  Take 1 tablet by mouth daily.     lovastatin 20 MG tablet  Commonly known as:  MEVACOR     metFORMIN 1000 MG tablet  Commonly known as:  GLUCOPHAGE  Take 1,000 mg by mouth 2 (two) times daily with a meal.     montelukast 10 MG tablet  Commonly known as:  SINGULAIR  TAKE ONE TABLET ONCE DAILY     ofloxacin 0.3 % ophthalmic solution  Commonly known as:  OCUFLOX  Place 1 drop into the right eye 4 (four) times daily.     tamsulosin 0.4 MG Caps capsule  Commonly known as:  FLOMAX  Take 0.4 mg by mouth daily.        Past Medical History  Diagnosis Date  . Hypertension   . High cholesterol   . Diabetes mellitus without complication ([place])     dx 1991  . Anxiety   . BPH (benign prostatic hyperplasia)   . Cancer ([place])  prostate --  borderline...only takes meds   psa was elevated  . HOH (hard of hearing)   . Depression     Past Surgical History  Procedure Laterality Date  . Vasectomy    . Tonsillectomy    . Eye surgery      cataracts bilateral.   macular pucker  . Lumbar laminectomy/decompression microdiscectomy N/A 03/04/2015    Procedure: Lumbar two-Lumbar five decompressive lumbar laminectomies;  Surgeon: Jovita Gamma, MD;  Location: [facility];  Service: Neurosurgery;  Laterality: N/A;    Allergies  Allergen Reactions  . Beef-Derived Products Anaphylaxis  . Lactose Intolerance (Gi) Anaphylaxis and Diarrhea    Gas  . Pork-Derived Products Anaphylaxis    Review of systems negative except as noted in HPI / PMHx or noted below:  Review of Systems  Constitutional: Negative.   HENT: Negative.   Eyes: Negative.   Respiratory: Negative.   Cardiovascular: Negative.   Gastrointestinal: Negative.   Genitourinary: Negative.   Musculoskeletal: Negative.   Skin: Negative.   Neurological: Negative.   Endo/Heme/Allergies:  Negative.   Psychiatric/Behavioral: Negative.      Objective:   Filed Vitals:   04/17/15 1529  BP: 132/80  Pulse: 74  Resp: 18          Physical Exam  Constitutional: He is well-developed, well-nourished, and in no distress.  HENT:  Head: Normocephalic.  Right Ear: Tympanic membrane, external ear and ear canal normal.  Left Ear: Tympanic membrane, external ear and ear canal normal.  Nose: Nose normal. No mucosal edema or rhinorrhea.  Mouth/Throat: Uvula is midline, oropharynx is clear and moist and mucous membranes are normal. No oropharyngeal exudate.  Eyes: Conjunctivae are normal.  Neck: Trachea normal. No tracheal tenderness present. No tracheal deviation present. No thyromegaly present.  Cardiovascular: Normal rate, regular rhythm, S1 normal, S2 normal and normal heart sounds.   No murmur heard. Pulmonary/Chest: Breath sounds normal. No stridor. No respiratory distress. He has no wheezes. He has no rales.  Musculoskeletal: He exhibits no edema.  Lymphadenopathy:       Head (right side): No tonsillar adenopathy present.       Head (left side): No tonsillar adenopathy present.    He has no cervical adenopathy.    He has no axillary adenopathy.  Neurological: He is alert. Gait normal.  Skin: No rash noted. He is not diaphoretic. No erythema. Nails show no clubbing.  Psychiatric: Mood and affect normal.    Diagnostics: none  Assessment and Plan:   1. Allergic rhinoconjunctivitis   2. Food allergy   3. Eustachian tube disorder, left      1. Continue mammal consumption avoidance  2. Epi-pen, benadryl, MD/ER for allergic reaction  3. Treat inflammation:   A. OTC Rhinocort one spray each nostril one time per day.  B. montelukast 10 mg one tablet one time per day  C. Depomedrol 80 IM delivered in the clinic today  4. If needed:   A. OTC nasal saline  B. OTC antihistamine - Zyrtec/Claritin/Allegra   5. ENT evaluation for ETD   7. Return to clinic in 1  year or earlier if problem   Concerning HM's issue with mantle allergy he will just continue to remain away from consumption of this food product at this point in time. I did give him systemic steroids today to help with some of his upper airway inflammation and a station tube dysfunction but I think he does need to go see an ear nose and throat  physician for further evaluation of this issue and we'll try to get this arranged sometime over the course of the next few weeks. I'll see him back in this clinic in approximately one year or earlier if there is a problem.  Allena Katz, MD [place]

## 2015-04-24 DIAGNOSIS — E118 Type 2 diabetes mellitus with unspecified complications: Secondary | ICD-10-CM | POA: Diagnosis not present

## 2015-04-24 DIAGNOSIS — E7800 Pure hypercholesterolemia, unspecified: Secondary | ICD-10-CM | POA: Diagnosis not present

## 2015-04-29 DIAGNOSIS — E7800 Pure hypercholesterolemia, unspecified: Secondary | ICD-10-CM | POA: Diagnosis not present

## 2015-04-29 DIAGNOSIS — N289 Disorder of kidney and ureter, unspecified: Secondary | ICD-10-CM | POA: Diagnosis not present

## 2015-04-29 DIAGNOSIS — Z1389 Encounter for screening for other disorder: Secondary | ICD-10-CM | POA: Diagnosis not present

## 2015-04-29 DIAGNOSIS — I10 Essential (primary) hypertension: Secondary | ICD-10-CM | POA: Diagnosis not present

## 2015-04-29 DIAGNOSIS — E11319 Type 2 diabetes mellitus with unspecified diabetic retinopathy without macular edema: Secondary | ICD-10-CM | POA: Diagnosis not present

## 2015-05-07 DIAGNOSIS — Z9109 Other allergy status, other than to drugs and biological substances: Secondary | ICD-10-CM | POA: Diagnosis not present

## 2015-05-07 DIAGNOSIS — J3489 Other specified disorders of nose and nasal sinuses: Secondary | ICD-10-CM | POA: Diagnosis not present

## 2015-05-07 DIAGNOSIS — R0982 Postnasal drip: Secondary | ICD-10-CM | POA: Diagnosis not present

## 2015-05-08 DIAGNOSIS — H35371 Puckering of macula, right eye: Secondary | ICD-10-CM | POA: Diagnosis not present

## 2015-05-28 DIAGNOSIS — Z9889 Other specified postprocedural states: Secondary | ICD-10-CM | POA: Diagnosis not present

## 2015-06-06 DIAGNOSIS — J300 Vasomotor rhinitis: Secondary | ICD-10-CM | POA: Diagnosis not present

## 2015-06-06 DIAGNOSIS — R0982 Postnasal drip: Secondary | ICD-10-CM | POA: Diagnosis not present

## 2015-06-06 DIAGNOSIS — J209 Acute bronchitis, unspecified: Secondary | ICD-10-CM | POA: Diagnosis not present

## 2015-06-06 DIAGNOSIS — J019 Acute sinusitis, unspecified: Secondary | ICD-10-CM | POA: Diagnosis not present

## 2015-06-08 DIAGNOSIS — Z9889 Other specified postprocedural states: Secondary | ICD-10-CM | POA: Insufficient documentation

## 2015-07-08 DIAGNOSIS — R0982 Postnasal drip: Secondary | ICD-10-CM | POA: Diagnosis not present

## 2015-07-08 DIAGNOSIS — H938X2 Other specified disorders of left ear: Secondary | ICD-10-CM | POA: Diagnosis not present

## 2015-08-02 DIAGNOSIS — E113412 Type 2 diabetes mellitus with severe nonproliferative diabetic retinopathy with macular edema, left eye: Secondary | ICD-10-CM | POA: Diagnosis not present

## 2015-08-02 DIAGNOSIS — H04123 Dry eye syndrome of bilateral lacrimal glands: Secondary | ICD-10-CM | POA: Diagnosis not present

## 2015-08-02 DIAGNOSIS — E113411 Type 2 diabetes mellitus with severe nonproliferative diabetic retinopathy with macular edema, right eye: Secondary | ICD-10-CM | POA: Diagnosis not present

## 2015-08-02 DIAGNOSIS — H35031 Hypertensive retinopathy, right eye: Secondary | ICD-10-CM | POA: Diagnosis not present

## 2015-08-02 DIAGNOSIS — H35032 Hypertensive retinopathy, left eye: Secondary | ICD-10-CM | POA: Diagnosis not present

## 2015-08-07 DIAGNOSIS — H903 Sensorineural hearing loss, bilateral: Secondary | ICD-10-CM | POA: Diagnosis not present

## 2015-08-07 DIAGNOSIS — H918X1 Other specified hearing loss, right ear: Secondary | ICD-10-CM | POA: Diagnosis not present

## 2015-08-07 DIAGNOSIS — H938X2 Other specified disorders of left ear: Secondary | ICD-10-CM | POA: Diagnosis not present

## 2015-08-07 DIAGNOSIS — R0982 Postnasal drip: Secondary | ICD-10-CM | POA: Diagnosis not present

## 2015-08-16 DIAGNOSIS — N401 Enlarged prostate with lower urinary tract symptoms: Secondary | ICD-10-CM | POA: Diagnosis not present

## 2015-08-16 DIAGNOSIS — C61 Malignant neoplasm of prostate: Secondary | ICD-10-CM | POA: Diagnosis not present

## 2015-08-16 DIAGNOSIS — R350 Frequency of micturition: Secondary | ICD-10-CM | POA: Diagnosis not present

## 2015-09-02 DIAGNOSIS — E7800 Pure hypercholesterolemia, unspecified: Secondary | ICD-10-CM | POA: Diagnosis not present

## 2015-09-02 DIAGNOSIS — E1140 Type 2 diabetes mellitus with diabetic neuropathy, unspecified: Secondary | ICD-10-CM | POA: Diagnosis not present

## 2015-09-04 DIAGNOSIS — I10 Essential (primary) hypertension: Secondary | ICD-10-CM | POA: Diagnosis not present

## 2015-09-04 DIAGNOSIS — E7800 Pure hypercholesterolemia, unspecified: Secondary | ICD-10-CM | POA: Diagnosis not present

## 2015-09-04 DIAGNOSIS — Z9181 History of falling: Secondary | ICD-10-CM | POA: Diagnosis not present

## 2015-09-04 DIAGNOSIS — E1140 Type 2 diabetes mellitus with diabetic neuropathy, unspecified: Secondary | ICD-10-CM | POA: Diagnosis not present

## 2015-09-04 DIAGNOSIS — Z23 Encounter for immunization: Secondary | ICD-10-CM | POA: Diagnosis not present

## 2015-09-04 DIAGNOSIS — E1122 Type 2 diabetes mellitus with diabetic chronic kidney disease: Secondary | ICD-10-CM | POA: Diagnosis not present

## 2015-10-04 DIAGNOSIS — H5310 Unspecified subjective visual disturbances: Secondary | ICD-10-CM | POA: Diagnosis not present

## 2015-10-04 DIAGNOSIS — G43109 Migraine with aura, not intractable, without status migrainosus: Secondary | ICD-10-CM | POA: Diagnosis not present

## 2015-10-04 DIAGNOSIS — E113412 Type 2 diabetes mellitus with severe nonproliferative diabetic retinopathy with macular edema, left eye: Secondary | ICD-10-CM | POA: Diagnosis not present

## 2015-10-04 DIAGNOSIS — E113411 Type 2 diabetes mellitus with severe nonproliferative diabetic retinopathy with macular edema, right eye: Secondary | ICD-10-CM | POA: Diagnosis not present

## 2015-10-09 DIAGNOSIS — I10 Essential (primary) hypertension: Secondary | ICD-10-CM | POA: Diagnosis not present

## 2015-10-09 DIAGNOSIS — H539 Unspecified visual disturbance: Secondary | ICD-10-CM | POA: Diagnosis not present

## 2015-11-07 DIAGNOSIS — E113593 Type 2 diabetes mellitus with proliferative diabetic retinopathy without macular edema, bilateral: Secondary | ICD-10-CM | POA: Diagnosis not present

## 2015-11-07 DIAGNOSIS — H35043 Retinal micro-aneurysms, unspecified, bilateral: Secondary | ICD-10-CM | POA: Diagnosis not present

## 2015-11-07 DIAGNOSIS — H31009 Unspecified chorioretinal scars, unspecified eye: Secondary | ICD-10-CM | POA: Diagnosis not present

## 2015-11-07 DIAGNOSIS — E113393 Type 2 diabetes mellitus with moderate nonproliferative diabetic retinopathy without macular edema, bilateral: Secondary | ICD-10-CM | POA: Diagnosis not present

## 2016-04-16 ENCOUNTER — Ambulatory Visit: Payer: Medicare Other | Admitting: Allergy and Immunology

## 2016-05-11 ENCOUNTER — Telehealth: Payer: Self-pay | Admitting: Pediatrics

## 2016-05-11 NOTE — Telephone Encounter (Signed)
Adjusted fee - kt

## 2016-05-11 NOTE — Telephone Encounter (Signed)
Please call patient back regarding a bill that he is upset about. Stated he cancelled the appointment and does not feel that he should be charged the no show fee.

## 2016-08-06 DIAGNOSIS — E113393 Type 2 diabetes mellitus with moderate nonproliferative diabetic retinopathy without macular edema, bilateral: Secondary | ICD-10-CM | POA: Diagnosis not present

## 2016-08-06 DIAGNOSIS — H31009 Unspecified chorioretinal scars, unspecified eye: Secondary | ICD-10-CM | POA: Diagnosis not present

## 2016-08-06 DIAGNOSIS — H35043 Retinal micro-aneurysms, unspecified, bilateral: Secondary | ICD-10-CM | POA: Diagnosis not present

## 2016-08-06 DIAGNOSIS — H47291 Other optic atrophy, right eye: Secondary | ICD-10-CM | POA: Diagnosis not present

## 2016-08-06 DIAGNOSIS — H43812 Vitreous degeneration, left eye: Secondary | ICD-10-CM | POA: Diagnosis not present

## 2016-09-09 DIAGNOSIS — E538 Deficiency of other specified B group vitamins: Secondary | ICD-10-CM | POA: Diagnosis not present

## 2016-09-09 DIAGNOSIS — Z79899 Other long term (current) drug therapy: Secondary | ICD-10-CM | POA: Diagnosis not present

## 2016-09-09 DIAGNOSIS — E1140 Type 2 diabetes mellitus with diabetic neuropathy, unspecified: Secondary | ICD-10-CM | POA: Diagnosis not present

## 2016-09-09 DIAGNOSIS — E7800 Pure hypercholesterolemia, unspecified: Secondary | ICD-10-CM | POA: Diagnosis not present

## 2016-09-11 DIAGNOSIS — E118 Type 2 diabetes mellitus with unspecified complications: Secondary | ICD-10-CM | POA: Diagnosis not present

## 2016-09-11 DIAGNOSIS — E1122 Type 2 diabetes mellitus with diabetic chronic kidney disease: Secondary | ICD-10-CM | POA: Diagnosis not present

## 2016-09-11 DIAGNOSIS — Z9181 History of falling: Secondary | ICD-10-CM | POA: Diagnosis not present

## 2016-09-11 DIAGNOSIS — Z139 Encounter for screening, unspecified: Secondary | ICD-10-CM | POA: Diagnosis not present

## 2016-09-11 DIAGNOSIS — E7800 Pure hypercholesterolemia, unspecified: Secondary | ICD-10-CM | POA: Diagnosis not present

## 2016-09-11 DIAGNOSIS — I10 Essential (primary) hypertension: Secondary | ICD-10-CM | POA: Diagnosis not present

## 2016-10-01 DIAGNOSIS — Z125 Encounter for screening for malignant neoplasm of prostate: Secondary | ICD-10-CM | POA: Diagnosis not present

## 2016-10-01 DIAGNOSIS — Z1389 Encounter for screening for other disorder: Secondary | ICD-10-CM | POA: Diagnosis not present

## 2016-10-01 DIAGNOSIS — Z9181 History of falling: Secondary | ICD-10-CM | POA: Diagnosis not present

## 2016-10-01 DIAGNOSIS — Z0000 Encounter for general adult medical examination without abnormal findings: Secondary | ICD-10-CM | POA: Diagnosis not present

## 2016-10-01 DIAGNOSIS — Z136 Encounter for screening for cardiovascular disorders: Secondary | ICD-10-CM | POA: Diagnosis not present

## 2016-10-01 DIAGNOSIS — Z139 Encounter for screening, unspecified: Secondary | ICD-10-CM | POA: Diagnosis not present

## 2016-10-08 DIAGNOSIS — L820 Inflamed seborrheic keratosis: Secondary | ICD-10-CM | POA: Diagnosis not present

## 2016-10-08 DIAGNOSIS — L2089 Other atopic dermatitis: Secondary | ICD-10-CM | POA: Diagnosis not present

## 2016-10-08 DIAGNOSIS — L570 Actinic keratosis: Secondary | ICD-10-CM | POA: Diagnosis not present

## 2016-10-08 DIAGNOSIS — D2372 Other benign neoplasm of skin of left lower limb, including hip: Secondary | ICD-10-CM | POA: Diagnosis not present

## 2017-01-20 DIAGNOSIS — E1122 Type 2 diabetes mellitus with diabetic chronic kidney disease: Secondary | ICD-10-CM | POA: Diagnosis not present

## 2017-01-20 DIAGNOSIS — E7800 Pure hypercholesterolemia, unspecified: Secondary | ICD-10-CM | POA: Diagnosis not present

## 2017-01-20 DIAGNOSIS — E1140 Type 2 diabetes mellitus with diabetic neuropathy, unspecified: Secondary | ICD-10-CM | POA: Diagnosis not present

## 2017-01-22 DIAGNOSIS — I10 Essential (primary) hypertension: Secondary | ICD-10-CM | POA: Diagnosis not present

## 2017-01-22 DIAGNOSIS — E1140 Type 2 diabetes mellitus with diabetic neuropathy, unspecified: Secondary | ICD-10-CM | POA: Diagnosis not present

## 2017-01-22 DIAGNOSIS — Z79899 Other long term (current) drug therapy: Secondary | ICD-10-CM | POA: Diagnosis not present

## 2017-01-22 DIAGNOSIS — E7800 Pure hypercholesterolemia, unspecified: Secondary | ICD-10-CM | POA: Diagnosis not present

## 2017-01-22 DIAGNOSIS — E1122 Type 2 diabetes mellitus with diabetic chronic kidney disease: Secondary | ICD-10-CM | POA: Diagnosis not present

## 2017-02-04 DIAGNOSIS — H35033 Hypertensive retinopathy, bilateral: Secondary | ICD-10-CM | POA: Diagnosis not present

## 2017-02-04 DIAGNOSIS — E113393 Type 2 diabetes mellitus with moderate nonproliferative diabetic retinopathy without macular edema, bilateral: Secondary | ICD-10-CM | POA: Diagnosis not present

## 2017-02-04 DIAGNOSIS — H43813 Vitreous degeneration, bilateral: Secondary | ICD-10-CM | POA: Diagnosis not present

## 2017-02-04 DIAGNOSIS — H47291 Other optic atrophy, right eye: Secondary | ICD-10-CM | POA: Diagnosis not present

## 2017-02-09 DIAGNOSIS — E113491 Type 2 diabetes mellitus with severe nonproliferative diabetic retinopathy without macular edema, right eye: Secondary | ICD-10-CM | POA: Diagnosis not present

## 2017-02-09 DIAGNOSIS — E113592 Type 2 diabetes mellitus with proliferative diabetic retinopathy without macular edema, left eye: Secondary | ICD-10-CM | POA: Diagnosis not present

## 2017-02-09 DIAGNOSIS — H47291 Other optic atrophy, right eye: Secondary | ICD-10-CM | POA: Diagnosis not present

## 2017-02-09 DIAGNOSIS — H4312 Vitreous hemorrhage, left eye: Secondary | ICD-10-CM | POA: Diagnosis not present

## 2017-02-11 DIAGNOSIS — E113592 Type 2 diabetes mellitus with proliferative diabetic retinopathy without macular edema, left eye: Secondary | ICD-10-CM | POA: Diagnosis not present

## 2017-03-02 DIAGNOSIS — J189 Pneumonia, unspecified organism: Secondary | ICD-10-CM | POA: Diagnosis not present

## 2017-03-09 DIAGNOSIS — E113592 Type 2 diabetes mellitus with proliferative diabetic retinopathy without macular edema, left eye: Secondary | ICD-10-CM | POA: Diagnosis not present

## 2017-03-09 DIAGNOSIS — H4312 Vitreous hemorrhage, left eye: Secondary | ICD-10-CM | POA: Diagnosis not present

## 2017-03-09 DIAGNOSIS — H43812 Vitreous degeneration, left eye: Secondary | ICD-10-CM | POA: Diagnosis not present

## 2017-04-26 DIAGNOSIS — C61 Malignant neoplasm of prostate: Secondary | ICD-10-CM | POA: Diagnosis not present

## 2017-04-26 DIAGNOSIS — N401 Enlarged prostate with lower urinary tract symptoms: Secondary | ICD-10-CM | POA: Diagnosis not present

## 2017-05-19 DIAGNOSIS — E1122 Type 2 diabetes mellitus with diabetic chronic kidney disease: Secondary | ICD-10-CM | POA: Diagnosis not present

## 2017-05-19 DIAGNOSIS — E118 Type 2 diabetes mellitus with unspecified complications: Secondary | ICD-10-CM | POA: Diagnosis not present

## 2017-05-19 DIAGNOSIS — E7800 Pure hypercholesterolemia, unspecified: Secondary | ICD-10-CM | POA: Diagnosis not present

## 2017-05-21 DIAGNOSIS — E1140 Type 2 diabetes mellitus with diabetic neuropathy, unspecified: Secondary | ICD-10-CM | POA: Diagnosis not present

## 2017-05-21 DIAGNOSIS — Z1331 Encounter for screening for depression: Secondary | ICD-10-CM | POA: Diagnosis not present

## 2017-06-01 DIAGNOSIS — H47291 Other optic atrophy, right eye: Secondary | ICD-10-CM | POA: Diagnosis not present

## 2017-06-01 DIAGNOSIS — E113592 Type 2 diabetes mellitus with proliferative diabetic retinopathy without macular edema, left eye: Secondary | ICD-10-CM | POA: Diagnosis not present

## 2017-06-01 DIAGNOSIS — E113491 Type 2 diabetes mellitus with severe nonproliferative diabetic retinopathy without macular edema, right eye: Secondary | ICD-10-CM | POA: Diagnosis not present

## 2017-06-01 DIAGNOSIS — H4312 Vitreous hemorrhage, left eye: Secondary | ICD-10-CM | POA: Diagnosis not present

## 2017-07-01 DIAGNOSIS — L308 Other specified dermatitis: Secondary | ICD-10-CM | POA: Diagnosis not present

## 2017-07-01 DIAGNOSIS — C44519 Basal cell carcinoma of skin of other part of trunk: Secondary | ICD-10-CM | POA: Diagnosis not present

## 2017-07-20 DIAGNOSIS — L2089 Other atopic dermatitis: Secondary | ICD-10-CM | POA: Diagnosis not present

## 2017-07-20 DIAGNOSIS — L308 Other specified dermatitis: Secondary | ICD-10-CM | POA: Diagnosis not present

## 2017-07-26 DIAGNOSIS — L299 Pruritus, unspecified: Secondary | ICD-10-CM | POA: Diagnosis not present

## 2017-07-26 DIAGNOSIS — R58 Hemorrhage, not elsewhere classified: Secondary | ICD-10-CM | POA: Diagnosis not present

## 2017-07-26 DIAGNOSIS — R21 Rash and other nonspecific skin eruption: Secondary | ICD-10-CM | POA: Diagnosis not present

## 2017-08-03 ENCOUNTER — Encounter: Payer: Self-pay | Admitting: Allergy

## 2017-08-03 ENCOUNTER — Ambulatory Visit: Payer: Medicare Other | Admitting: Allergy

## 2017-08-03 VITALS — BP 168/70 | HR 72 | Resp 16 | Ht 68.0 in | Wt 264.8 lb

## 2017-08-03 DIAGNOSIS — L309 Dermatitis, unspecified: Secondary | ICD-10-CM | POA: Diagnosis not present

## 2017-08-03 DIAGNOSIS — Z91018 Allergy to other foods: Secondary | ICD-10-CM | POA: Diagnosis not present

## 2017-08-03 MED ORDER — EPINEPHRINE 0.3 MG/0.3ML IJ SOAJ: INTRAMUSCULAR | 1 refills | Status: DC

## 2017-08-03 NOTE — Progress Notes (Signed)
Follow-up Note  RE: Nathaniel Gray. MRN: [id number] DOB: 02/11/1935 Date of Office Visit: 08/03/2017   History of present illness: Nathaniel Gray Pring. is a 82 y.o. male presenting today for new complaint of eczema.  He was last seen in the office on 04/17/15 by Dr. Neldon Mc for allergic rhinoconjunctivitis, alpha gal allergy and eustachian tube dysfunction.  He presents today with his fiance.    He states about 2 months ago he had an itchy rash develop.  It comes and goes and moves around his body.  Affected areas have included arms, legs, chest, back, scalp.  He states he itches a lot especially at night and it is keeping him up at night and affected quality of life.  He states he scratches all day long.  He has a large bruise on his left arm he states from scratching all day.  He saw his PCP who referred to dermatology (Dr. Jarome Matin) and diagnosed with eczema.  They started him on triamcinolone for body and fluocinonide for scalp.  He also has received cortisone injection from dermatology on follow-up visit as the topical therapies were not effective. He states the injection may have worked for several days before he started itching again.  He is currently on a prednisone taper since this past Sunday and believes this has been the most helpful therapy.  He might be allergic to something that is triggering the rash.   He does have a history of alpha gal allergy and has been avoiding red meat products without any accidental ingestions.  He also states in the past he was told he has a dairy allergy but has not completely been avoiding dairy.  He states he does eat cheese.  Most of his scratching is done over night where he does feel itchier.  He does not have a current epinephrine device.   His fiance states they also got a new dog about 6 months ago or so and it was a rescue that was an outdoor dog.  The dog is now and indoor/outdoor dog.  Fiance states has not been washed since they got the dog.  The  dog also sleeps in the bedroom.  Does not believe the dog has fleas.   He denies any nasal congestion/drainage or sneezing.  He does have dry eye and is using Systane drops daily.   Review of systems: Review of Systems  Constitutional: Negative for chills, fever and malaise/fatigue.  HENT: Negative for congestion, ear discharge, ear pain, nosebleeds and sore throat.   Eyes: Negative for pain, discharge and redness.  Respiratory: Negative for cough, shortness of breath and wheezing.   Cardiovascular: Negative for chest pain.  Gastrointestinal: Negative for abdominal pain, constipation, diarrhea, heartburn, nausea and vomiting.  Musculoskeletal: Negative for joint pain.  Skin: Positive for itching and rash.  Neurological: Negative for headaches.    All other systems negative unless noted above in HPI  Past medical/social/surgical/family history have been reviewed and are unchanged unless specifically indicated below.  No changes  Medication List: Allergies as of 08/03/2017      Reactions   Beef-derived Products Anaphylaxis   Lactose Intolerance (gi) Anaphylaxis, Diarrhea   Gas   Pork-derived Products Anaphylaxis      Medication List        Accurate as of 08/03/17 12:13 PM. Always use your most recent med list.          acetaminophen 500 MG tablet Commonly known as:  TYLENOL Take  1,000 mg by mouth daily as needed for mild pain.   ALPRAZolam 0.5 MG tablet Commonly known as:  XANAX Take 0.5 mg by mouth daily as needed for anxiety.   aspirin EC 81 MG tablet Take 81 mg by mouth daily.   buPROPion 300 MG 24 hr tablet Commonly known as:  WELLBUTRIN XL Take 300 mg by mouth daily.   cyanocobalamin 1000 MCG tablet Take 1,000 mcg by mouth daily.   EPINEPHrine 0.3 mg/0.3 mL Soaj injection Commonly known as:  EPIPEN Inject 0.3 mLs (0.3 mg total) into the muscle once.   escitalopram 10 MG tablet Commonly known as:  LEXAPRO TAKE 1 TABLET BY MOUTH ONCE DAILY FOR ANXIETY     finasteride 5 MG tablet Commonly known as:  PROSCAR Take 5 mg by mouth daily.   Fish Oil 1200 MG Caps Take 1,200 mg by mouth 2 (two) times daily.   fluocinonide 0.05 % external solution Commonly known as:  LIDEX APPLY 1 ML TOPICALLY TWICE DAILY   loratadine 10 MG tablet Commonly known as:  CLARITIN Take 10 mg by mouth daily.   losartan-hydrochlorothiazide 100-25 MG tablet Commonly known as:  HYZAAR Take 1 tablet by mouth daily.   lovastatin 20 MG tablet Commonly known as:  MEVACOR   metFORMIN 1000 MG tablet Commonly known as:  GLUCOPHAGE Take 1,000 mg by mouth 2 (two) times daily with a meal.   montelukast 10 MG tablet Commonly known as:  SINGULAIR TAKE ONE TABLET ONCE DAILY   NOVOLIN N RELION 100 UNIT/ML injection Generic drug:  insulin NPH Human INJECT 40 TO 50 UNITS SUB Q TWICE DAILY. (TITRATE UP UNTIL FASTING AND BEDTIME GLUCOSE ARE LESS THAN 140)   predniSONE 10 MG tablet Commonly known as:  DELTASONE TAKE AS DIRECTED SEE ATTACHED NOTE   sitaGLIPtin 100 MG tablet Commonly known as:  JANUVIA Take 100 mg by mouth daily.   tamsulosin 0.4 MG Caps capsule Commonly known as:  FLOMAX Take 0.4 mg by mouth daily.   triamcinolone cream 0.1 % Commonly known as:  KENALOG APPLY CREAM TO RASH TWICE DAILY       Known medication allergies: Allergies  Allergen Reactions  . Beef-Derived Products Anaphylaxis  . Lactose Intolerance (Gi) Anaphylaxis and Diarrhea    Gas  . Pork-Derived Products Anaphylaxis     Physical examination: Blood pressure (!) 168/70, pulse 72, resp. rate 16, height 5\' 8"  (1.727 m), weight 264 lb 12.8 oz (120.1 kg).  General: Alert, interactive, in no acute distress. HEENT: PERRLA, TMs pearly gray, turbinates non-edematous without discharge, post-pharynx non erythematous. Neck: Supple without lymphadenopathy. Lungs: Clear to auscultation without wheezing, rhonchi or rales. {no increased work of breathing. CV: Normal S1, S2 without  murmurs. Abdomen: Nondistended, nontender. Skin: erythematous papules with excoriations on b/l forearms and LE.  Left forearm with large bruise with irregular border. Telangiectasia on cheek extending to nasal bridge Extremities:  No clubbing, cyanosis or edema. Neuro:   Grossly intact.  Diagnositics/Labs: Labs:   Component     Latest Ref Rng & Units 03/18/2015  Beef (Bos spp) IgE     <0.35 kU/L 1.97 (H)  Class Interpretation      2  Lamb/Mutton (Ovis spp) IgE     <0.35 kU/L 1.34 (H)  Class Interpretation      2  Pork (Sus spp) IgE     <0.35 kU/L 1.38 (H)  Class Interpretation      2  Alpha Gal IgE*     <0.35 kU/L 3.15 (  H)   Component     Latest Ref Rng & Units 03/18/2015  D Pteronyssinus IgE     Class 0 kU/L <0.10  D Farinae IgE     Class 0 kU/L <0.10  Cat Dander IgE     Class I kU/L 0.43 (A)  Dog Dander IgE     Class II kU/L 0.57 (A)  [place] Grass IgE     Class 0 kU/L <0.10  Timothy Grass IgE     Class 0 kU/L <0.10  Johnson Grass IgE     Class 0 kU/L <0.10  Bahia Grass IgE     Class 0 kU/L <0.10  Cockroach, American IgE     Class 0 kU/L <0.10  Penicillium Chrysogen IgE     Class 0 kU/L <0.10  Cladosporium Herbarum IgE     Class 0 kU/L <0.10  Aspergillus Fumigatus IgE     Class 0 kU/L <0.10  Mucor Racemosus IgE     Class 0 kU/L <0.10  Alternaria Alternata IgE     Class 0 kU/L <0.10  Stemphylium Herbarum IgE     Class 0 kU/L <0.10  Common Silver Wendee Copp IgE     Class 0 kU/L <0.10  Oak, White IgE     Class 0 kU/L <0.10  Elm, American IgE     Class 0 kU/L <0.10  Maple/Box Elder IgE     Class 0 kU/L <0.10  Hickory, White IgE     Class 0 kU/L <0.10  Amer Sycamore IgE Qn     Class 0 kU/L <0.10  White Mulberry IgE     Class 0 kU/L <0.10  Sweet gum IgE RAST Ql     Class 0 kU/L <0.10  Cedar, Mountain IgE     Class 0 kU/L <0.10  Ragweed, Short IgE     Class 0 kU/L <0.10  Mugwort IgE Qn     Class 0 kU/L <0.10  Plantain, English IgE     Class 0 kU/L  <0.10  Pigweed, Rough IgE     Class 0 kU/L <0.10  Sheep Sorrel IgE Qn     Class 0 kU/L <0.10  Nettle IgE     Class 0 kU/L <0.10   Assessment and plan:   Dermatitis   - relatively new onset of eczema diagnosed by dermatologist.  Concerned that dermatitis may not be true atopic dermatitis given appearance and lack of improvement with standard treatment (topical steroid, steroid injection and oral steroid)   - topical steroids have not been very effective in treating rash   - will have you try Eucrisa, a non-steroidal eczema cream, that can be used anywhere on the body.  Apply thin layer twice a day to rash. May be used alone or layered with topical steroids   - continue at least twice a day application of moisturizer   - after bathing/showering pat dry (do not have to be completely dry) then apply medicated creams/ointments and/or moisturization   - change Loratadine to Cetirizine 10mg  daily which may help with itch   - will place referral for second opinion with possible biopsy if deemed appropriate by dermatology   - you do have sensitivity to dog and cat on past environmental allergy testing.  Your dog in the home may be contributing to current rash.  Recommend dog not sleep in bedroom and recommend at least monthly washing of dog.    Alpha gal allergy   - continue avoidance of all red meat products.  Would also avoid dairy until labs return   - will obtain repeat alpha gal panel as well as milk IgE level    - have access to self-injectable epinephrine Epipen 0.3mg  at all times    - follow emergency action plan in case of allergic reaction  Follow-up 3-4 months or sooner if needed  I appreciate the opportunity to take part in Khyron's care. Please do not hesitate to contact me with questions.  Sincerely,   Prudy Feeler, MD Allergy/Immunology [facility]

## 2017-08-03 NOTE — Patient Instructions (Addendum)
Dermatitis   - relatively new onset of eczema diagnosed by dermatologist   - topical steroids have not been very effective in treating rash   - will have you try Eucrisa, a non-steroidal eczema cream, that can be used anywhere on the body.  Apply thin layer twice a day to rash. May be used alone or layered with topical steroids   - continue at least twice a day application of moisturizer   - after bathing/showering pat dry (do not have to be completely dry) then apply medicated creams/ointments and/or moisturization   - change Loratadine to Cetirizine 10mg  daily    - will place referral for second opinion with possible biopsy if deemed appropriate by dermatology   - you do have sensitivity to dog and cat on past environmental allergy testing.  Your dog in the home may be contributing to current rash.  Recommend dog not sleep in bedroom and recommend at least monthly washing of dog.    Alpha gal allergy   - continue avoidance of all red meat products.  Would also avoid dairy until labs return   - will obtain repeat alpha gal panel as well as milk IgE level    - have access to self-injectable epinephrine Epipen 0.3mg  at all times    - follow emergency action plan in case of allergic reaction  Follow-up 3-4 months or sooner if needed

## 2017-08-05 ENCOUNTER — Telehealth: Payer: Self-pay | Admitting: *Deleted

## 2017-08-05 NOTE — Telephone Encounter (Signed)
[facility] responded to our referral request, Nathaniel Gray has an appt with them on 08/17/17 at 10:05 am.

## 2017-08-10 LAB — ALLERGEN MILK: Milk IgE: 1.02 kU/L — AB

## 2017-08-10 LAB — ALPHA-GAL PANEL
Alpha Gal IgE*: 5.17 kU/L — ABNORMAL HIGH (ref <0.10)
Beef (Bos spp) IgE: 3.24 kU/L — ABNORMAL HIGH (ref <0.35)
Class Interpretation: 2
Class Interpretation: 2
Class Interpretation: 2
Lamb/Mutton (Ovis spp) IgE: 1.69 kU/L — ABNORMAL HIGH (ref <0.35)
Pork (Sus spp) IgE: 2.13 kU/L — ABNORMAL HIGH (ref <0.35)

## 2017-08-17 DIAGNOSIS — L300 Nummular dermatitis: Secondary | ICD-10-CM | POA: Diagnosis not present

## 2017-08-17 DIAGNOSIS — R233 Spontaneous ecchymoses: Secondary | ICD-10-CM | POA: Diagnosis not present

## 2017-08-17 DIAGNOSIS — L309 Dermatitis, unspecified: Secondary | ICD-10-CM | POA: Diagnosis not present

## 2017-08-17 DIAGNOSIS — D485 Neoplasm of uncertain behavior of skin: Secondary | ICD-10-CM | POA: Diagnosis not present

## 2017-08-25 DIAGNOSIS — L299 Pruritus, unspecified: Secondary | ICD-10-CM | POA: Diagnosis not present

## 2017-08-27 ENCOUNTER — Encounter: Payer: Self-pay | Admitting: Allergy

## 2017-08-31 DIAGNOSIS — L299 Pruritus, unspecified: Secondary | ICD-10-CM | POA: Diagnosis not present

## 2017-08-31 DIAGNOSIS — L300 Nummular dermatitis: Secondary | ICD-10-CM | POA: Diagnosis not present

## 2017-09-07 DIAGNOSIS — L300 Nummular dermatitis: Secondary | ICD-10-CM | POA: Diagnosis not present

## 2017-09-09 DIAGNOSIS — L300 Nummular dermatitis: Secondary | ICD-10-CM | POA: Diagnosis not present

## 2017-09-09 DIAGNOSIS — L299 Pruritus, unspecified: Secondary | ICD-10-CM | POA: Diagnosis not present

## 2017-09-21 DIAGNOSIS — E1122 Type 2 diabetes mellitus with diabetic chronic kidney disease: Secondary | ICD-10-CM | POA: Diagnosis not present

## 2017-09-21 DIAGNOSIS — E118 Type 2 diabetes mellitus with unspecified complications: Secondary | ICD-10-CM | POA: Diagnosis not present

## 2017-09-21 DIAGNOSIS — E7800 Pure hypercholesterolemia, unspecified: Secondary | ICD-10-CM | POA: Diagnosis not present

## 2017-09-23 DIAGNOSIS — E7800 Pure hypercholesterolemia, unspecified: Secondary | ICD-10-CM | POA: Diagnosis not present

## 2017-09-23 DIAGNOSIS — E1140 Type 2 diabetes mellitus with diabetic neuropathy, unspecified: Secondary | ICD-10-CM | POA: Diagnosis not present

## 2017-09-23 DIAGNOSIS — I10 Essential (primary) hypertension: Secondary | ICD-10-CM | POA: Diagnosis not present

## 2017-10-04 DIAGNOSIS — Z139 Encounter for screening, unspecified: Secondary | ICD-10-CM | POA: Diagnosis not present

## 2017-10-04 DIAGNOSIS — E785 Hyperlipidemia, unspecified: Secondary | ICD-10-CM | POA: Diagnosis not present

## 2017-10-04 DIAGNOSIS — Z0000 Encounter for general adult medical examination without abnormal findings: Secondary | ICD-10-CM | POA: Diagnosis not present

## 2017-10-04 DIAGNOSIS — Z136 Encounter for screening for cardiovascular disorders: Secondary | ICD-10-CM | POA: Diagnosis not present

## 2017-10-11 DIAGNOSIS — L300 Nummular dermatitis: Secondary | ICD-10-CM | POA: Diagnosis not present

## 2017-10-13 DIAGNOSIS — E1122 Type 2 diabetes mellitus with diabetic chronic kidney disease: Secondary | ICD-10-CM | POA: Diagnosis not present

## 2017-10-13 DIAGNOSIS — R600 Localized edema: Secondary | ICD-10-CM | POA: Diagnosis not present

## 2017-10-13 DIAGNOSIS — E1140 Type 2 diabetes mellitus with diabetic neuropathy, unspecified: Secondary | ICD-10-CM | POA: Diagnosis not present

## 2017-10-13 DIAGNOSIS — Z23 Encounter for immunization: Secondary | ICD-10-CM | POA: Diagnosis not present

## 2017-10-13 DIAGNOSIS — J209 Acute bronchitis, unspecified: Secondary | ICD-10-CM | POA: Diagnosis not present

## 2017-10-18 DIAGNOSIS — L300 Nummular dermatitis: Secondary | ICD-10-CM | POA: Diagnosis not present

## 2017-10-18 DIAGNOSIS — E11319 Type 2 diabetes mellitus with unspecified diabetic retinopathy without macular edema: Secondary | ICD-10-CM | POA: Diagnosis not present

## 2017-10-18 DIAGNOSIS — R600 Localized edema: Secondary | ICD-10-CM | POA: Diagnosis not present

## 2017-10-28 DIAGNOSIS — L300 Nummular dermatitis: Secondary | ICD-10-CM | POA: Diagnosis not present

## 2017-11-04 DIAGNOSIS — H4312 Vitreous hemorrhage, left eye: Secondary | ICD-10-CM | POA: Diagnosis not present

## 2017-11-04 DIAGNOSIS — E113491 Type 2 diabetes mellitus with severe nonproliferative diabetic retinopathy without macular edema, right eye: Secondary | ICD-10-CM | POA: Diagnosis not present

## 2017-11-04 DIAGNOSIS — H47291 Other optic atrophy, right eye: Secondary | ICD-10-CM | POA: Diagnosis not present

## 2017-11-04 DIAGNOSIS — E113592 Type 2 diabetes mellitus with proliferative diabetic retinopathy without macular edema, left eye: Secondary | ICD-10-CM | POA: Diagnosis not present

## 2017-11-08 DIAGNOSIS — E1122 Type 2 diabetes mellitus with diabetic chronic kidney disease: Secondary | ICD-10-CM | POA: Diagnosis not present

## 2017-11-08 DIAGNOSIS — E538 Deficiency of other specified B group vitamins: Secondary | ICD-10-CM | POA: Diagnosis not present

## 2017-11-08 DIAGNOSIS — R05 Cough: Secondary | ICD-10-CM | POA: Diagnosis not present

## 2017-11-08 DIAGNOSIS — R609 Edema, unspecified: Secondary | ICD-10-CM | POA: Diagnosis not present

## 2017-11-08 DIAGNOSIS — N184 Chronic kidney disease, stage 4 (severe): Secondary | ICD-10-CM | POA: Diagnosis not present

## 2017-11-08 DIAGNOSIS — D649 Anemia, unspecified: Secondary | ICD-10-CM | POA: Diagnosis not present

## 2017-11-08 DIAGNOSIS — I129 Hypertensive chronic kidney disease with stage 1 through stage 4 chronic kidney disease, or unspecified chronic kidney disease: Secondary | ICD-10-CM | POA: Diagnosis not present

## 2017-11-09 ENCOUNTER — Ambulatory Visit
Admission: RE | Admit: 2017-11-09 | Discharge: 2017-11-09 | Disposition: A | Discharge status: Home / Self Care | Payer: Medicare Other | Source: Ambulatory Visit | Attending: Internal Medicine | Admitting: Internal Medicine

## 2017-11-09 ENCOUNTER — Other Ambulatory Visit: Payer: Self-pay | Admitting: Internal Medicine

## 2017-11-09 ENCOUNTER — Other Ambulatory Visit (HOSPITAL_COMMUNITY): Payer: Self-pay | Admitting: Internal Medicine

## 2017-11-09 DIAGNOSIS — R059 Cough, unspecified: Secondary | ICD-10-CM

## 2017-11-09 DIAGNOSIS — R05 Cough: Secondary | ICD-10-CM

## 2017-11-09 DIAGNOSIS — N289 Disorder of kidney and ureter, unspecified: Secondary | ICD-10-CM

## 2017-11-10 ENCOUNTER — Other Ambulatory Visit: Payer: Self-pay | Admitting: Internal Medicine

## 2017-11-10 DIAGNOSIS — N184 Chronic kidney disease, stage 4 (severe): Secondary | ICD-10-CM

## 2017-11-11 DIAGNOSIS — E113592 Type 2 diabetes mellitus with proliferative diabetic retinopathy without macular edema, left eye: Secondary | ICD-10-CM | POA: Diagnosis not present

## 2017-11-16 ENCOUNTER — Ambulatory Visit (HOSPITAL_COMMUNITY)
Admission: RE | Admit: 2017-11-16 | Discharge: 2017-11-16 | Disposition: A | Discharge status: Home / Self Care | Payer: Medicare Other | Source: Ambulatory Visit | Attending: Internal Medicine | Admitting: Internal Medicine

## 2017-11-16 ENCOUNTER — Ambulatory Visit
Admission: RE | Admit: 2017-11-16 | Discharge: 2017-11-16 | Disposition: A | Discharge status: Home / Self Care | Payer: Medicare Other | Source: Ambulatory Visit | Attending: Internal Medicine | Admitting: Internal Medicine

## 2017-11-16 DIAGNOSIS — N289 Disorder of kidney and ureter, unspecified: Secondary | ICD-10-CM | POA: Diagnosis not present

## 2017-11-16 DIAGNOSIS — N184 Chronic kidney disease, stage 4 (severe): Secondary | ICD-10-CM | POA: Insufficient documentation

## 2017-11-16 DIAGNOSIS — R05 Cough: Secondary | ICD-10-CM | POA: Insufficient documentation

## 2017-11-16 DIAGNOSIS — I129 Hypertensive chronic kidney disease with stage 1 through stage 4 chronic kidney disease, or unspecified chronic kidney disease: Secondary | ICD-10-CM | POA: Insufficient documentation

## 2017-11-16 DIAGNOSIS — E785 Hyperlipidemia, unspecified: Secondary | ICD-10-CM | POA: Diagnosis not present

## 2017-11-16 DIAGNOSIS — E1122 Type 2 diabetes mellitus with diabetic chronic kidney disease: Secondary | ICD-10-CM | POA: Diagnosis not present

## 2017-11-16 DIAGNOSIS — I10 Essential (primary) hypertension: Secondary | ICD-10-CM | POA: Diagnosis not present

## 2017-11-16 NOTE — Progress Notes (Signed)
2D Echocardiogram has been performed.  Nathaniel Gray 11/16/2017, 2:44 PM

## 2018-11-24 IMAGING — US US RENAL
1 series · 14 of 25 positions shown · non-contrast
Comparison: None.

CLINICAL DATA: 82-year-old male with chronic kidney disease stage
4. Initial encounter.

EXAM:
RENAL / URINARY TRACT ULTRASOUND COMPLETE

[Series 1: us renal · 0.26mm/px · 14 of 35 slices shown]
[im 1/35]
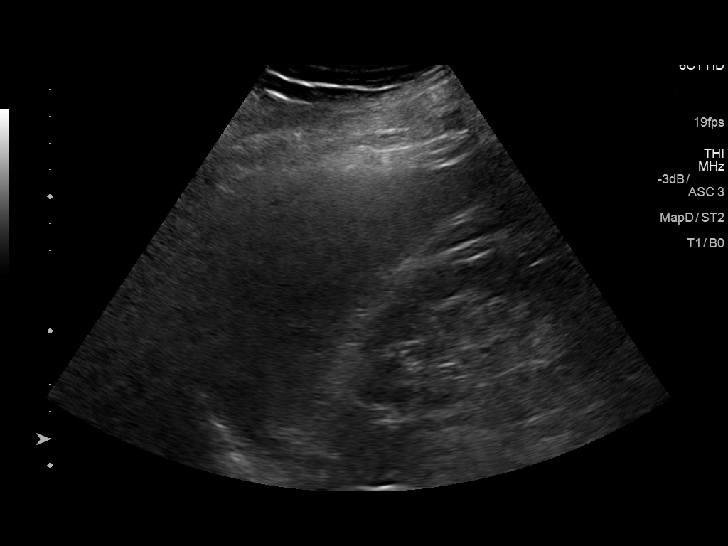
[im 3/35]
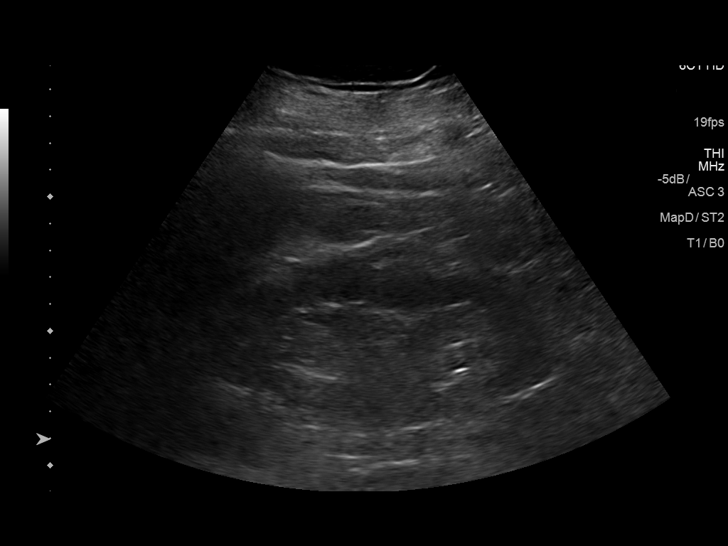
[im 6/35]
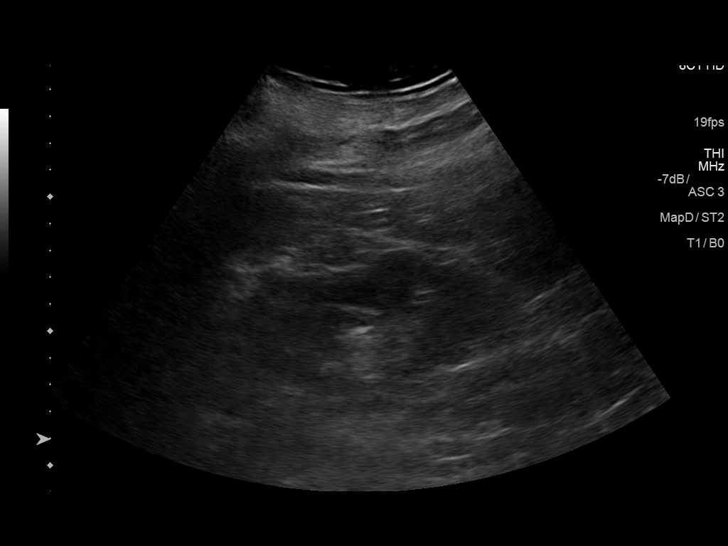
[im 9/35]
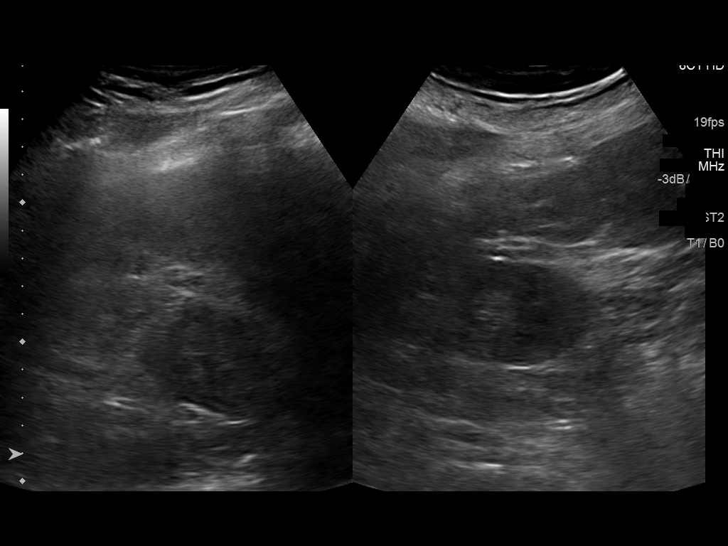
[im 12/35]
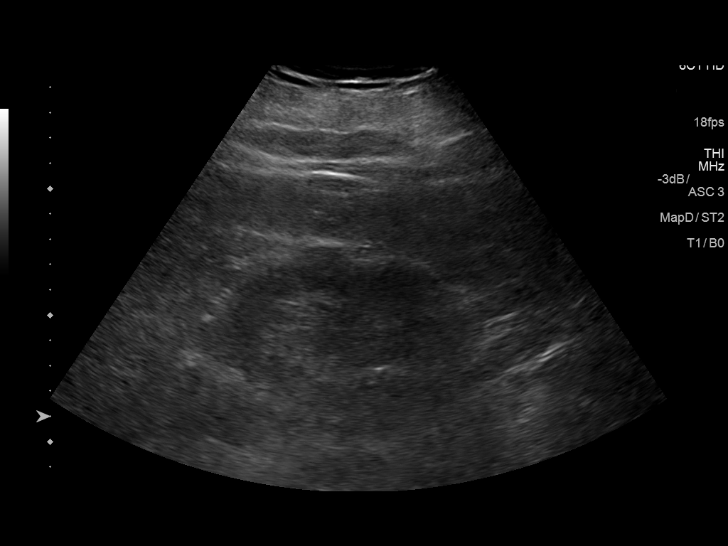
[im 13/35]
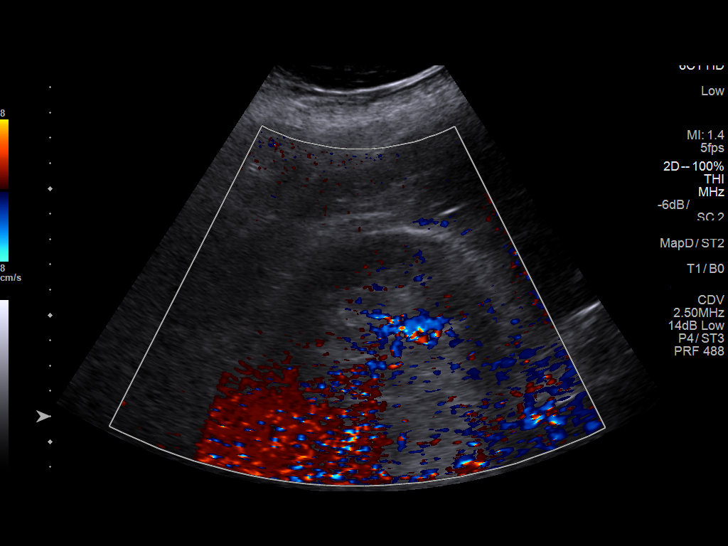
[im 16/35]
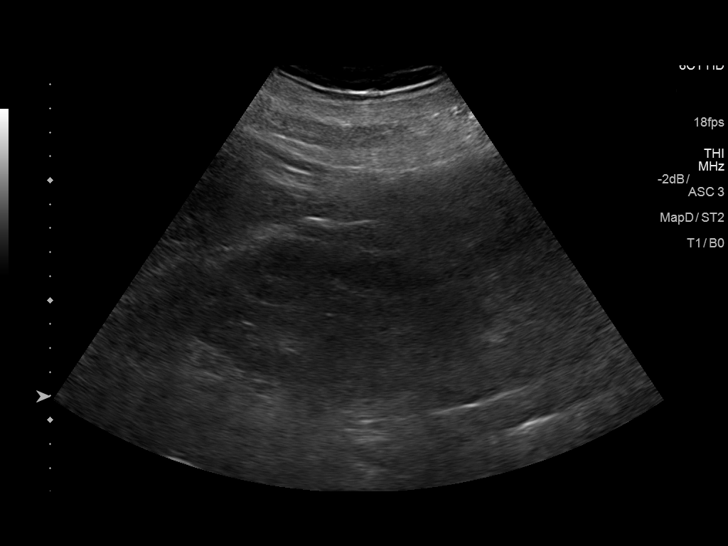
[im 19/35]
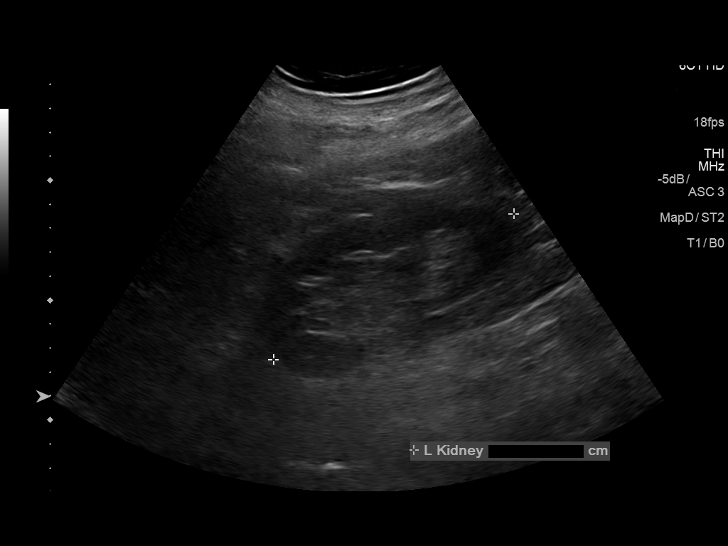
[im 22/35]
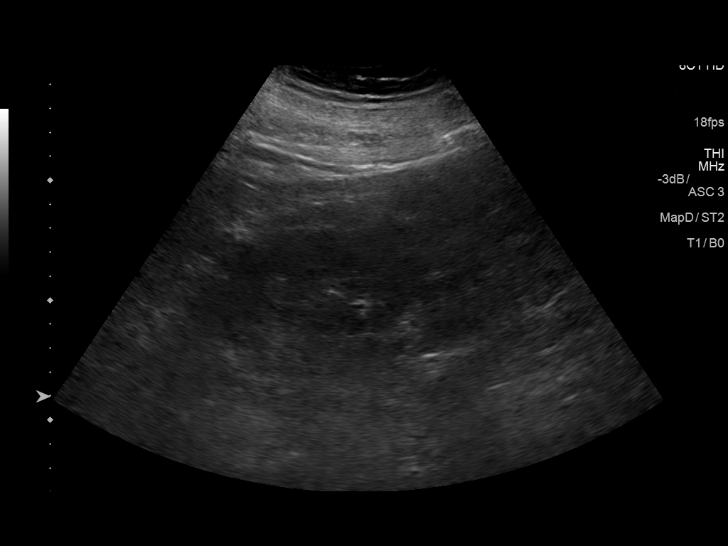
[im 23/35]
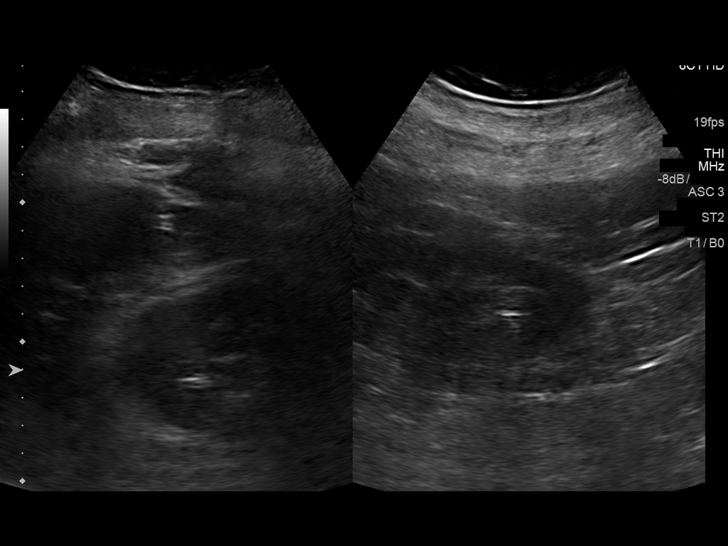
[im 26/35]
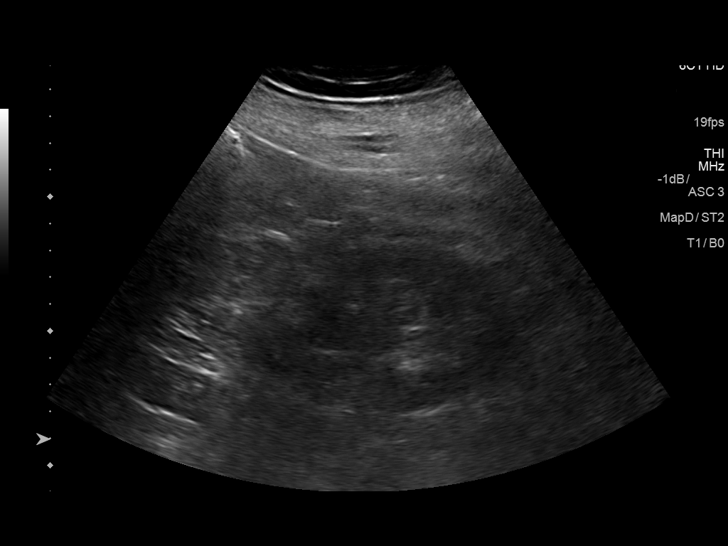
[im 29/35]
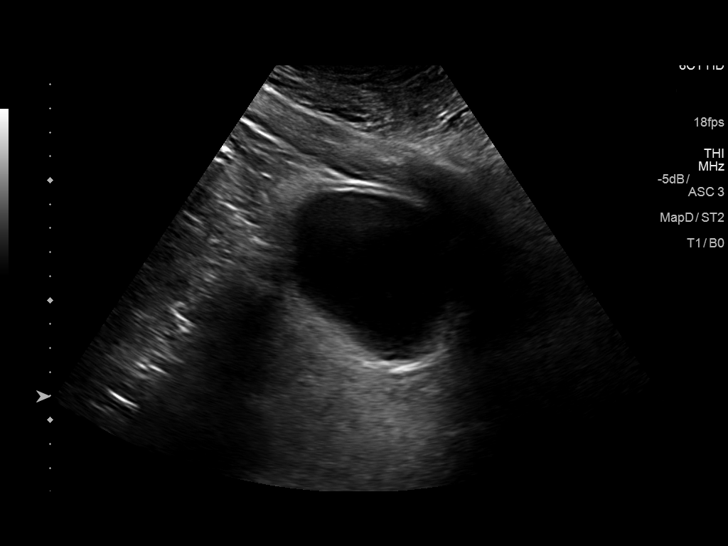
[im 32/35]
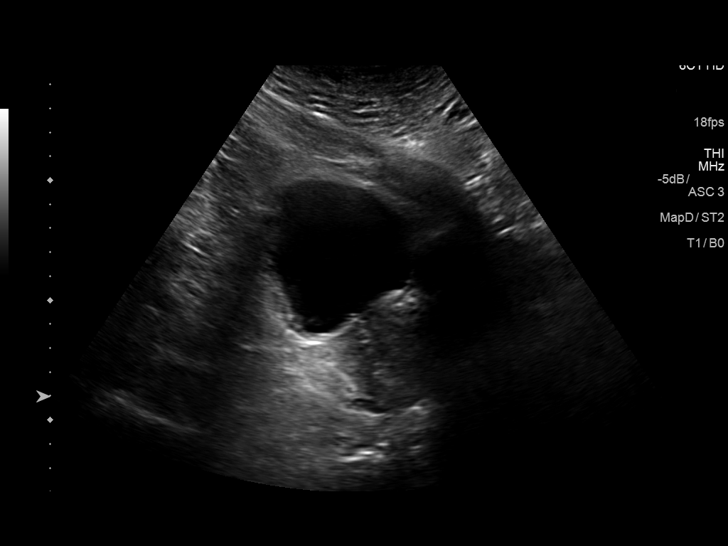
[im 35/35]
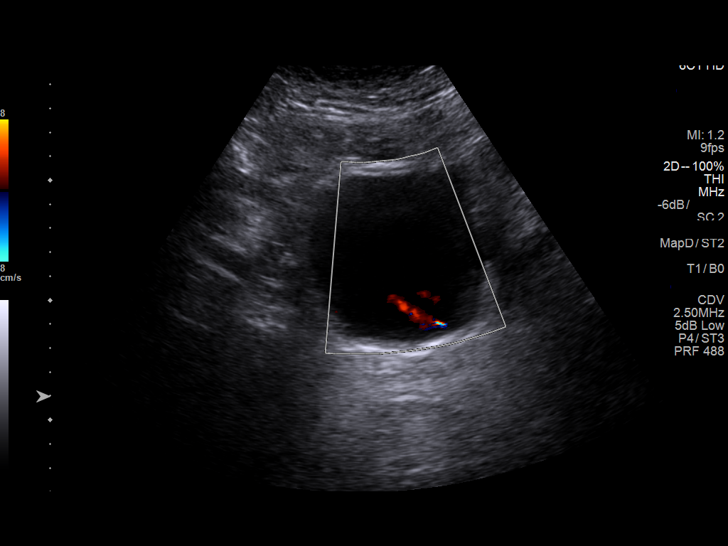

[14 of 25 positions shown; findings below may reference images not displayed]

FINDINGS: Right Kidney:

Length: 12.7 cm. Echogenicity within normal limits. Prominent pelvic
fat. No mass or hydronephrosis visualized.

Left Kidney:

Length: 11.7 cm. Echogenicity within normal limits. Prominent pelvic
fat. No mass or hydronephrosis visualized.

Bladder:

Appears normal for degree of bladder distention. Bilateral ureteral
jets noted.
IMPRESSION: 1. No hydronephrosis.
2. Renal parenchyma echogenicity within normal limits.

## 2019-03-07 IMAGING — CR DG CHEST 2V
3 series · 3 of 3 positions shown · non-contrast
Comparison: 10/02/2012

CLINICAL DATA: Semi productive cough and congestion for 1 month
question infiltrate, history diabetes mellitus, hypertension,
prostate cancer

EXAM:
CHEST - 2 VIEW

[w chest pa (1 of 2)]
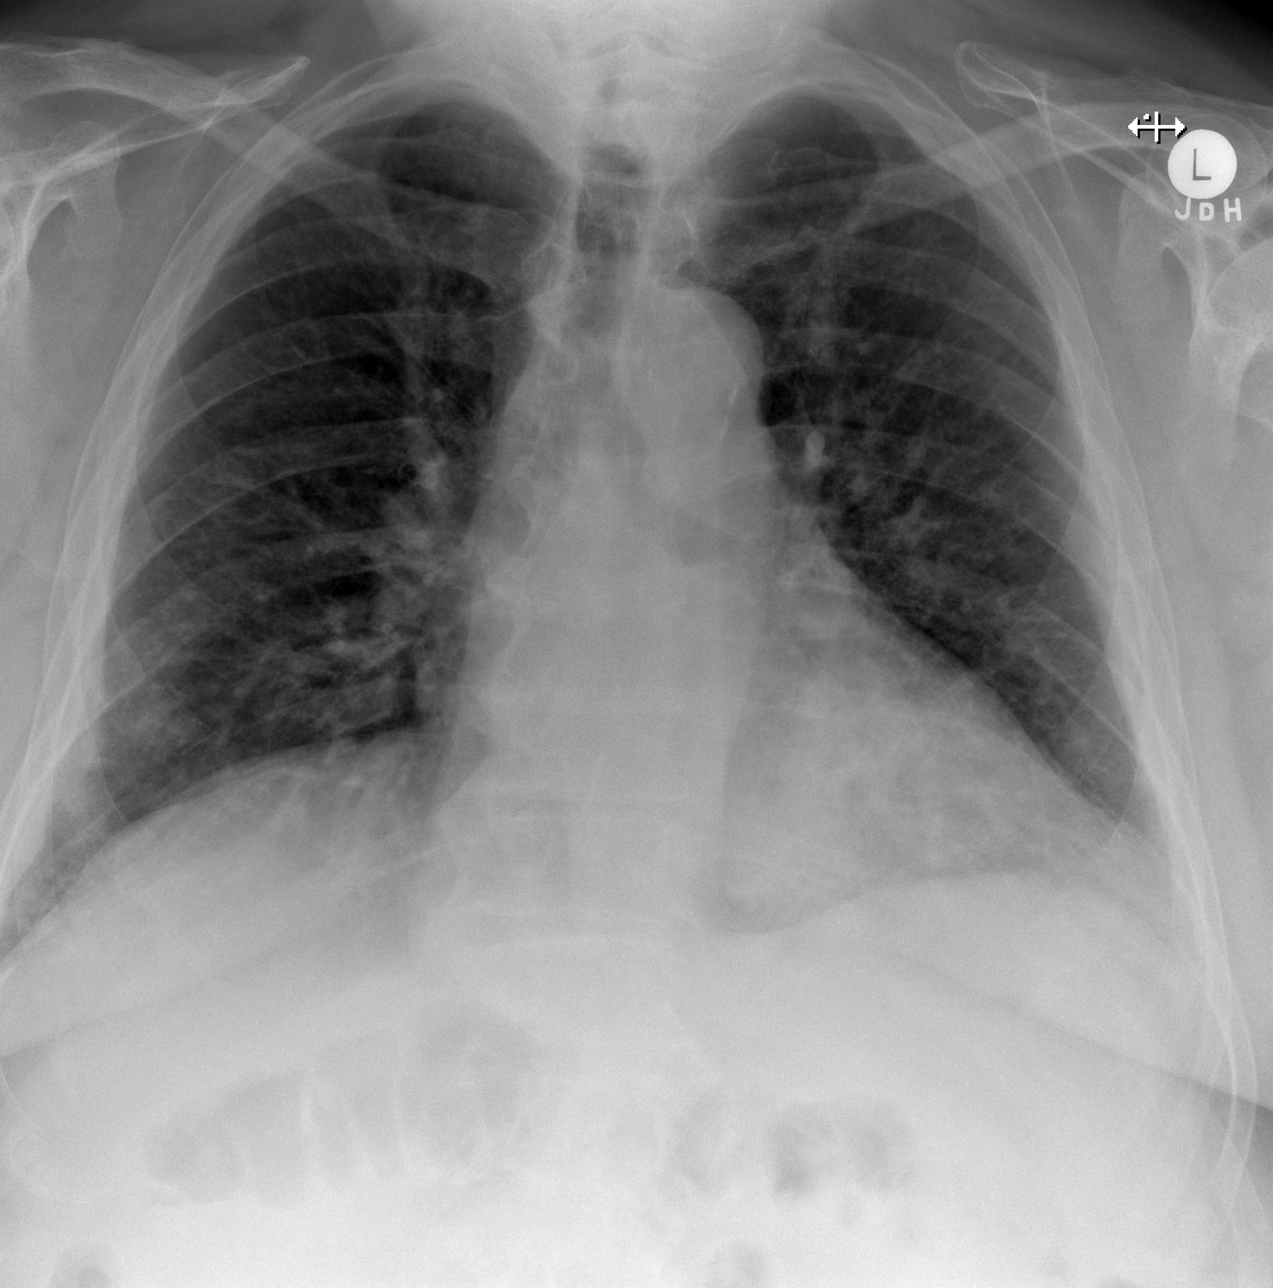

[w chest pa (2 of 2)]
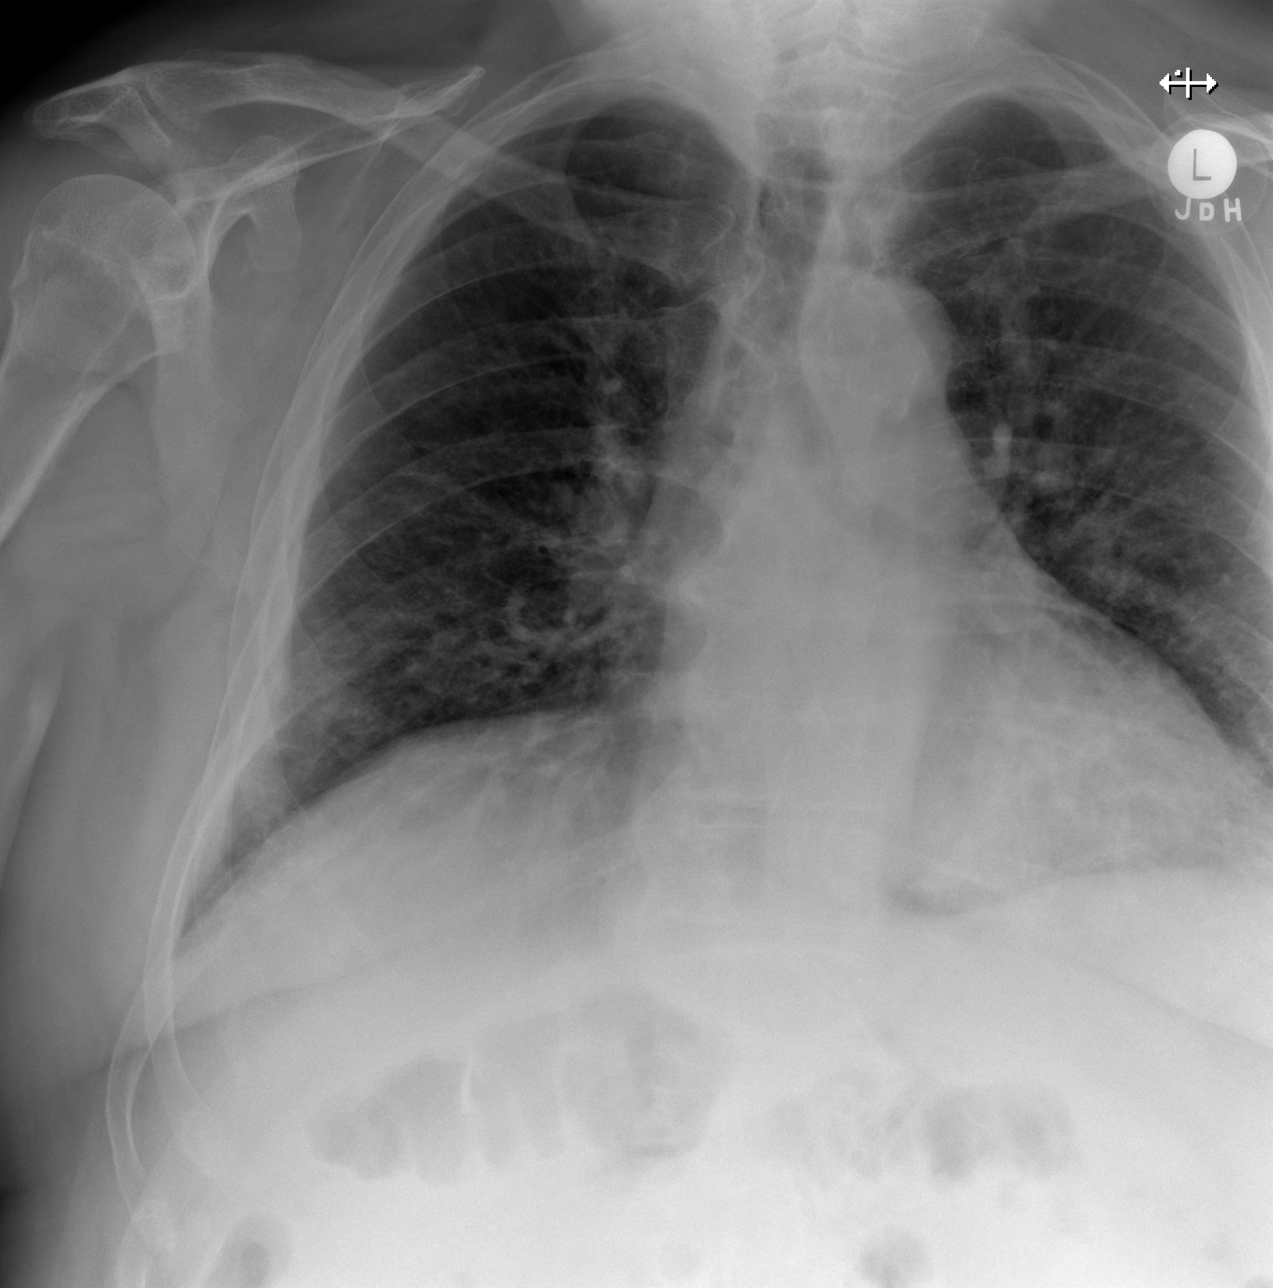

[w chest lat]
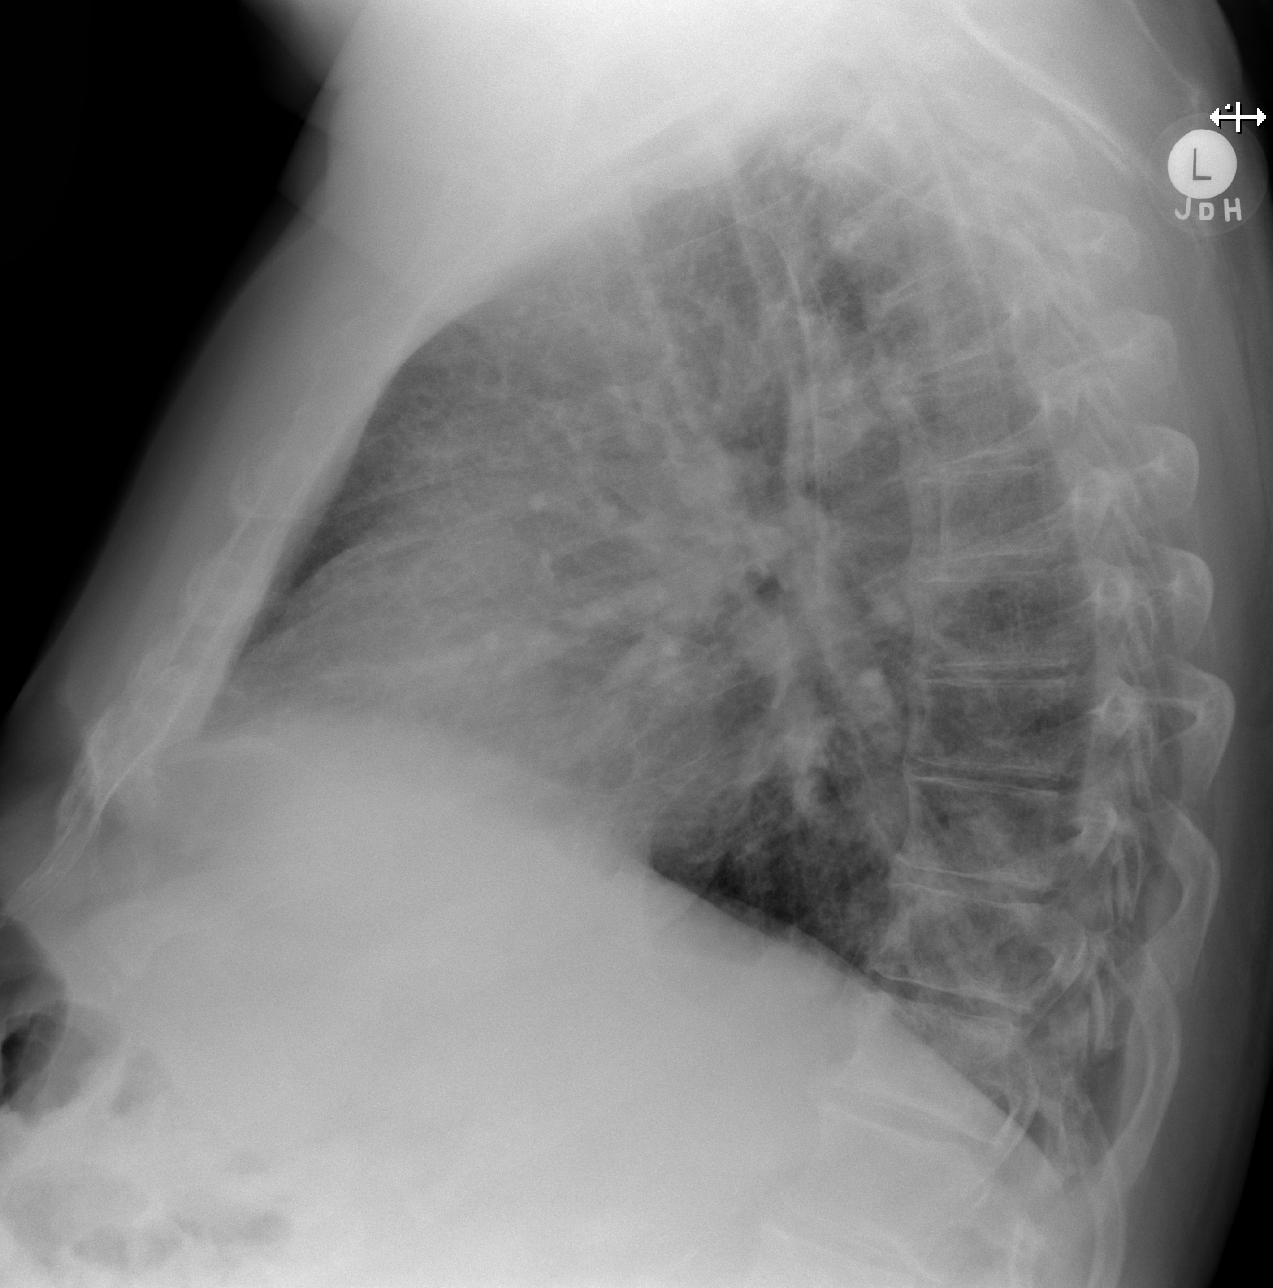

[3 of 3 positions shown; findings below may reference images not displayed]

FINDINGS: Enlargement of cardiac silhouette.

Mediastinal contours and pulmonary vascularity normal.

Atherosclerotic calcification aorta.

Question mild RIGHT basilar infiltrate.

Lungs otherwise clear.

No pleural effusion or pneumothorax.

Bones unremarkable.
IMPRESSION: Question mild RIGHT basilar infiltrate.

Enlargement of cardiac silhouette.

## 2019-06-08 ENCOUNTER — Ambulatory Visit (INDEPENDENT_AMBULATORY_CARE_PROVIDER_SITE_OTHER): Payer: Medicare Other | Admitting: Ophthalmology

## 2019-06-08 ENCOUNTER — Other Ambulatory Visit: Payer: Self-pay

## 2019-06-08 ENCOUNTER — Encounter (INDEPENDENT_AMBULATORY_CARE_PROVIDER_SITE_OTHER): Payer: Self-pay | Admitting: Ophthalmology

## 2019-06-08 DIAGNOSIS — Z794 Long term (current) use of insulin: Secondary | ICD-10-CM | POA: Diagnosis not present

## 2019-06-08 DIAGNOSIS — E113552 Type 2 diabetes mellitus with stable proliferative diabetic retinopathy, left eye: Secondary | ICD-10-CM

## 2019-06-08 DIAGNOSIS — E113532 Type 2 diabetes mellitus with proliferative diabetic retinopathy with traction retinal detachment not involving the macula, left eye: Secondary | ICD-10-CM

## 2019-06-08 DIAGNOSIS — H35372 Puckering of macula, left eye: Secondary | ICD-10-CM

## 2019-06-08 DIAGNOSIS — E113491 Type 2 diabetes mellitus with severe nonproliferative diabetic retinopathy without macular edema, right eye: Secondary | ICD-10-CM | POA: Diagnosis not present

## 2019-06-08 DIAGNOSIS — H47291 Other optic atrophy, right eye: Secondary | ICD-10-CM | POA: Diagnosis not present

## 2019-06-08 DIAGNOSIS — Z9889 Other specified postprocedural states: Secondary | ICD-10-CM

## 2019-06-08 NOTE — Progress Notes (Signed)
06/08/2019     CHIEF COMPLAINT Patient presents for Diabetic Eye Exam   HISTORY OF PRESENT ILLNESS: Nathaniel W Arth Hedinger. is a 84 y.o. male who presents to the clinic today for:   HPI    Diabetic Eye Exam    Vision is blurred for near.  Associated Symptoms Negative for Flashes and Floaters.  Diabetes characteristics include Type 2.  Blood sugar level is controlled.  Last A1C 10.  I, the attending physician,  performed the HPI with the patient and updated documentation appropriately.          Comments    6 Month Diabetic Exam OU. OCT. FP  Pt c/o NVA being blurry. Pt states he also has a hard time seeing the computer. Pt is currently on prednisone and BGL are elevated       Last edited by Tilda Franco on 06/08/2019  1:44 PM. (History)      Referring physician: Helen Hashimoto., MD [place],  [place] [id number]  HISTORICAL INFORMATION:   Selected notes from the MEDICAL RECORD NUMBER       CURRENT MEDICATIONS: No current outpatient medications on file. (Ophthalmic Drugs)   No current facility-administered medications for this visit. (Ophthalmic Drugs)   Current Outpatient Medications (Other)  Medication Sig  . acetaminophen (TYLENOL) 500 MG tablet Take 1,000 mg by mouth daily as needed for mild pain.  Marland Kitchen ALPRAZolam (XANAX) 0.5 MG tablet Take 0.5 mg by mouth daily as needed for anxiety.  Marland Kitchen aspirin EC 81 MG tablet Take 81 mg by mouth daily.  Marland Kitchen buPROPion (WELLBUTRIN XL) 300 MG 24 hr tablet Take 300 mg by mouth daily.  . cyanocobalamin 1000 MCG tablet Take 1,000 mcg by mouth daily.   Marland Kitchen EPINEPHrine 0.3 mg/0.3 mL IJ SOAJ injection Use as directed for life-threatening allergic reaction.  Marland Kitchen escitalopram (LEXAPRO) 10 MG tablet TAKE 1 TABLET BY MOUTH ONCE DAILY FOR ANXIETY  . finasteride (PROSCAR) 5 MG tablet Take 5 mg by mouth daily.  . fluocinonide (LIDEX) 0.05 % external solution APPLY 1 ML TOPICALLY TWICE DAILY  . loratadine (CLARITIN) 10 MG  tablet Take 10 mg by mouth daily.  Marland Kitchen losartan-hydrochlorothiazide (HYZAAR) 100-25 MG tablet Take 1 tablet by mouth daily.  Marland Kitchen lovastatin (MEVACOR) 20 MG tablet   . metFORMIN (GLUCOPHAGE) 1000 MG tablet Take 1,000 mg by mouth 2 (two) times daily with a meal.  . montelukast (SINGULAIR) 10 MG tablet TAKE ONE TABLET ONCE DAILY  . NOVOLIN N RELION 100 UNIT/ML injection INJECT 40 TO 50 UNITS SUB Q TWICE DAILY. (TITRATE UP UNTIL FASTING AND BEDTIME GLUCOSE ARE LESS THAN 140)  . Omega-3 Fatty Acids (FISH OIL) 1200 MG CAPS Take 1,200 mg by mouth 2 (two) times daily.  . predniSONE (DELTASONE) 10 MG tablet TAKE AS DIRECTED SEE ATTACHED NOTE  . sitaGLIPtin (JANUVIA) 100 MG tablet Take 100 mg by mouth daily.  . tamsulosin (FLOMAX) 0.4 MG CAPS capsule Take 0.4 mg by mouth daily.  Marland Kitchen triamcinolone cream (KENALOG) 0.1 % APPLY CREAM TO RASH TWICE DAILY   No current facility-administered medications for this visit. (Other)      REVIEW OF SYSTEMS:    ALLERGIES Allergies  Allergen Reactions  . Beef-Derived Products Anaphylaxis  . Lactose Intolerance (Gi) Anaphylaxis and Diarrhea    Gas  . Pork-Derived Products Anaphylaxis    PAST MEDICAL HISTORY Past Medical History:  Diagnosis Date  . Anxiety   . BPH (benign prostatic hyperplasia)   .  Cancer [facility])    prostate --  borderline...only takes meds   psa was elevated  . Depression   . Diabetes mellitus without complication ([place])    dx 1991  . Food allergy    Allergy to alpha-gal  . High cholesterol   . HOH (hard of hearing)   . Hypertension    Past Surgical History:  Procedure Laterality Date  . EYE SURGERY     cataracts bilateral.   macular pucker  . LUMBAR LAMINECTOMY/DECOMPRESSION MICRODISCECTOMY N/A 03/04/2015   Procedure: Lumbar two-Lumbar five decompressive lumbar laminectomies;  Surgeon: Jovita Gamma, MD;  Location: [place] [facility];  Service: Neurosurgery;  Laterality: N/A;  . TONSILLECTOMY    . VASECTOMY      FAMILY HISTORY Family  History  Problem Relation Age of Onset  . Food Allergy Father     SOCIAL HISTORY Social History   Tobacco Use  . Smoking status: Former Smoker    Packs/day: 2.00    Years: 35.00    Pack years: 70.00    Types: Cigarettes    Quit date: 01/19/1989    Years since quitting: 30.4  . Smokeless tobacco: Never Used  Substance Use Topics  . Alcohol use: Yes    Comment: Occassionally   . Drug use: No         OPHTHALMIC EXAM:  Base Eye Exam    Visual Acuity (Snellen - Linear)      Right Left   Dist cc 20/40 20/30 -1   Dist ph cc NI        Tonometry (Tonopen, 1:45 PM)      Right Left   Pressure 15 17       Pupils      Pupils Dark Light Shape React APD   Right PERRL 3 2 Round Brisk None   Left PERRL 3 2 Round Brisk None       Visual Fields (Counting fingers)      Left Right    Full Full       Neuro/Psych    Oriented x3: Yes   Mood/Affect: Normal       Dilation    Both eyes: 1.0% Mydriacyl, 2.5% Phenylephrine @ 1:45 PM        Slit Lamp and Fundus Exam    External Exam      Right Left   External Normal Normal       Slit Lamp Exam      Right Left   Lids/Lashes Normal Normal   Conjunctiva/Sclera White and quiet White and quiet   Cornea Clear Clear   Anterior Chamber Deep and quiet Deep and quiet   Iris Round and reactive Round and reactive   Lens Posterior chamber intraocular lens Posterior chamber intraocular lens   Anterior Vitreous Normal Normal       Fundus Exam      Right Left   Posterior Vitreous Normal Normal   Disc Normal Normal   C/D Ratio 0.6 0.5   Macula Microaneurysms, no clinically significant macular edema Microaneurysms, no clinically significant macular edema   Vessels NPDR-Severe PDR-quiet   Periphery Normal Good PRP          IMAGING AND PROCEDURES  Imaging and Procedures for 06/08/19  OCT, Retina - OU - Both Eyes       Right Eye Quality was good. Scan locations included subfoveal. Central Foveal Thickness: 320. Findings  include outer retinal atrophy, central retinal atrophy.   Left Eye Quality was good. Scan locations  included subfoveal. Central Foveal Thickness: 369. Progression has been stable. Findings include abnormal foveal contour, epiretinal membrane.   Notes Some 4 years status post vitrectomy membrane peel right eye.  Oscar La is somewhat atrophic from the epiretinal membrane.  Normal structures outer retina  OS minor retinal thickening on the basis of epiretinal membrane       Color Fundus Photography Optos - OU - Both Eyes       Right Eye Progression has been stable. Disc findings include normal observations. Macula : microaneurysms.   Left Eye Progression has been stable. Disc findings include normal observations. Macula : microaneurysms.   Notes OD, severe nonproliferative diabetic retinopathy.  Stable.  Clear media.  All intraretinal hemorrhage pericapillary nasally.  OS, proliferative diabetic retinopathy completely quiesced sent.  Good PRP.  Clear media.                ASSESSMENT/PLAN:  Severe nonproliferative diabetic retinopathy of right eye (HCC) The nature of severe nonproliferative diabetic retinopathy discussed with the patient as well as the need for more frequent follow up and likely progression to proliferative disease in the near future. The options of continued observation versus panretinal photocoagulation at this time were reviewed as well as the risks, benefits, and alternatives. More recent option includes the use of ocular injectable medications to slow progression of retinal disease. Tight control of glucose, blood pressure, and serum lipid levels were recommended under the direction of general physician or endocrinologist, as well as avoidance of smoking and maintenance of normal body weight. The 2-year risk of progression to proliferative diabetic retinopathy is 60%. OD  Left epiretinal membrane The nature of macular pucker (epiretinal membrane ERM) was  discussed with the patient as well as threshold criteria for vitrectomy surgery. I explained that in rare cases another surgery is needed to actually remove a second wrinkle should it regrow.  Most often, the epiretinal membrane and underlying wrinkled internal limiting membrane are removed with the first surgery, to accomplish the goals.   If the operative eye is [place] (natural lens still present), cataract surgery is often recommended prior to Vitrectomy. This will enable the retina surgeon to have the best view during surgery and the patient to obtain optimal results in the future. Treatment options were discussed. OS, minor.      ICD-10-CM   1. Left epiretinal membrane  H35.372 OCT, Retina - OU - Both Eyes  2. Controlled type 2 diabetes mellitus with left eye affected by proliferative retinopathy with traction retinal detachment not involving macula, with long-term current use of insulin (HCC)  [id number] OCT, Retina - OU - Both Eyes   Z79.4 Color Fundus Photography Optos - OU - Both Eyes  3. Severe nonproliferative diabetic retinopathy of right eye without macular edema associated with type 2 diabetes mellitus (HCC)  E11.3491 OCT, Retina - OU - Both Eyes    Color Fundus Photography Optos - OU - Both Eyes  4. Secondary optic atrophy, right  H47.291   5. Stable treated proliferative diabetic retinopathy of left eye determined by examination associated with type 2 diabetes mellitus ([place])  [id number]   6. History of vitrectomy  Z98.890     1.  Minor epiretinal membrane OS will observe  2.  OD with severe NPDR.  This requires follow-up examination every 6 months.  3.  Ophthalmic Meds Ordered this visit:  No orders of the defined types were placed in this encounter.      Return in about 6 months (around 12/09/2019)  for DILATE OU, OCT.  There are no Patient Instructions on file for this visit.   Explained the diagnoses, plan, and follow up with the patient and they expressed understanding.   Patient expressed understanding of the importance of proper follow up care.   Nathaniel Gray M.D. Diseases & Surgery of the Retina and Vitreous Retina & Diabetic [place] 06/08/19     Abbreviations: M myopia (nearsighted); A astigmatism; H hyperopia (farsighted); P presbyopia; Mrx spectacle prescription;  CTL contact lenses; OD right eye; OS left eye; OU both eyes  XT exotropia; ET esotropia; PEK punctate epithelial keratitis; PEE punctate epithelial erosions; DES dry eye syndrome; MGD meibomian gland dysfunction; ATs artificial tears; PFAT's preservative free artificial tears; [place] nuclear sclerotic cataract; PSC posterior subcapsular cataract; ERM epi-retinal membrane; PVD posterior vitreous detachment; RD retinal detachment; DM diabetes mellitus; DR diabetic retinopathy; NPDR non-proliferative diabetic retinopathy; PDR proliferative diabetic retinopathy; CSME clinically significant macular edema; DME diabetic macular edema; dbh dot blot hemorrhages; CWS cotton wool spot; POAG primary open angle glaucoma; C/D cup-to-disc ratio; HVF humphrey visual field; GVF goldmann visual field; OCT optical coherence tomography; IOP intraocular pressure; BRVO Branch retinal vein occlusion; CRVO central retinal vein occlusion; CRAO central retinal artery occlusion; BRAO branch retinal artery occlusion; RT retinal tear; SB scleral buckle; PPV pars plana vitrectomy; VH Vitreous hemorrhage; PRP panretinal laser photocoagulation; IVK intravitreal kenalog; VMT vitreomacular traction; MH Macular hole;  NVD neovascularization of the disc; NVE neovascularization elsewhere; AREDS age related eye disease study; ARMD age related macular degeneration; POAG primary open angle glaucoma; EBMD epithelial/anterior basement membrane dystrophy; ACIOL anterior chamber intraocular lens; IOL intraocular lens; PCIOL posterior chamber intraocular lens; Phaco/IOL phacoemulsification with intraocular lens placement; [place] photorefractive  keratectomy; LASIK laser assisted in situ keratomileusis; HTN hypertension; DM diabetes mellitus; COPD chronic obstructive pulmonary disease

## 2019-06-08 NOTE — Assessment & Plan Note (Signed)

## 2019-06-08 NOTE — Assessment & Plan Note (Signed)
The nature of severe nonproliferative diabetic retinopathy discussed with the patient as well as the need for more frequent follow up and likely progression to proliferative disease in the near future. The options of continued observation versus panretinal photocoagulation at this time were reviewed as well as the risks, benefits, and alternatives. More recent option includes the use of ocular injectable medications to slow progression of retinal disease. Tight control of glucose, blood pressure, and serum lipid levels were recommended under the direction of general physician or endocrinologist, as well as avoidance of smoking and maintenance of normal body weight. The 2-year risk of progression to proliferative diabetic retinopathy is 60%. OD

## 2019-08-15 ENCOUNTER — Ambulatory Visit: Payer: Medicare Other | Admitting: Pulmonary Disease

## 2019-08-15 ENCOUNTER — Ambulatory Visit (INDEPENDENT_AMBULATORY_CARE_PROVIDER_SITE_OTHER): Payer: Medicare Other

## 2019-08-15 ENCOUNTER — Encounter: Payer: Self-pay | Admitting: Pulmonary Disease

## 2019-08-15 ENCOUNTER — Other Ambulatory Visit: Payer: Self-pay

## 2019-08-15 VITALS — BP 138/66 | HR 76 | Temp 98.1°F | Ht 68.5 in | Wt 292.8 lb

## 2019-08-15 DIAGNOSIS — J441 Chronic obstructive pulmonary disease with (acute) exacerbation: Secondary | ICD-10-CM

## 2019-08-15 DIAGNOSIS — R062 Wheezing: Secondary | ICD-10-CM

## 2019-08-15 DIAGNOSIS — R05 Cough: Secondary | ICD-10-CM

## 2019-08-15 DIAGNOSIS — J449 Chronic obstructive pulmonary disease, unspecified: Secondary | ICD-10-CM

## 2019-08-15 DIAGNOSIS — R0600 Dyspnea, unspecified: Secondary | ICD-10-CM

## 2019-08-15 DIAGNOSIS — R059 Cough, unspecified: Secondary | ICD-10-CM

## 2019-08-15 DIAGNOSIS — G4733 Obstructive sleep apnea (adult) (pediatric): Secondary | ICD-10-CM | POA: Insufficient documentation

## 2019-08-15 DIAGNOSIS — R0609 Other forms of dyspnea: Secondary | ICD-10-CM

## 2019-08-15 LAB — CBC WITH DIFFERENTIAL/PLATELET
Basophils Absolute: 0.1 10*3/uL (ref 0.0–0.1)
Basophils Relative: 0.7 % (ref 0.0–3.0)
Eosinophils Absolute: 0.7 10*3/uL (ref 0.0–0.7)
Eosinophils Relative: 8 % — ABNORMAL HIGH (ref 0.0–5.0)
HCT: 34.2 % — ABNORMAL LOW (ref 39.0–52.0)
Hemoglobin: 11.5 g/dL — ABNORMAL LOW (ref 13.0–17.0)
Lymphocytes Relative: 27.5 % (ref 12.0–46.0)
Lymphs Abs: 2.4 10*3/uL (ref 0.7–4.0)
MCHC: 33.5 g/dL (ref 30.0–36.0)
MCV: 86.3 fl (ref 78.0–100.0)
Monocytes Absolute: 0.8 10*3/uL (ref 0.1–1.0)
Monocytes Relative: 8.5 % (ref 3.0–12.0)
Neutro Abs: 4.9 10*3/uL (ref 1.4–7.7)
Neutrophils Relative %: 55.3 % (ref 43.0–77.0)
Platelets: 146 10*3/uL — ABNORMAL LOW (ref 150.0–400.0)
RBC: 3.96 Mil/uL — ABNORMAL LOW (ref 4.22–5.81)
RDW: 14.5 % (ref 11.5–15.5)
WBC: 8.8 10*3/uL (ref 4.0–10.5)

## 2019-08-15 MED ORDER — AZITHROMYCIN 250 MG PO TABS: ORAL_TABLET | ORAL | 0 refills | Status: DC

## 2019-08-15 MED ORDER — PREDNISONE 20 MG PO TABS: 40.0000 mg | ORAL_TABLET | Freq: Every day | ORAL | 0 refills | Status: AC

## 2019-08-15 MED ORDER — BREZTRI AEROSPHERE 160-9-4.8 MCG/ACT IN AERO: 2.0000 | INHALATION_SPRAY | Freq: Two times a day (BID) | RESPIRATORY_TRACT | 0 refills | Status: DC

## 2019-08-15 NOTE — Patient Instructions (Addendum)
Nice to meet you!  Take prednisone 40 mg a day the next 5 days. Take azithromycin daily for the next 5 days. Please take your lasix 40 mg daily for the next 3 days.  Come back in 4-6 weeks with Dr. Silas Flood and for PFTs.

## 2019-08-15 NOTE — Progress Notes (Signed)
Patient ID: Nathaniel Keats., male    DOB: 1935/06/13, 84 y.o.   MRN: [id number]  Chief Complaint  Patient presents with  . Consult    shortness of breath with exertion, productive cough-clear to cream color , wheezing,     Referring provider: Cyndi Bender, PA-C  HPI:   Nathaniel Gray is a 84 year old man with PMH of OSA, insulin dependent diabetes, seasonal allergies, and reported COPD (no PFTs) who presents with recurrent DOE and cough whom we are seeing in consultation at the request of Cyndi Bender PA-C for evaluation of COPD.  Patient chief complaint to day is DOE and cough. Over the last 4 months he has had recurrent episodes of severe productive cough cough and DOE. No clear trigger. Improves rapidly while taking prednisone and an antibiotic. A day or two after stopping breathing starts to worsen. Progresses to severe DOE and wheezing. Cough brings up phlegm. No fever or chest pain. Uses albuterol every 3-4 hours. Mild relief. Taking symbicort for some time and unsure if it is helpful. Reviewed recent provider note documenting ongoing concerns for recurrent COPD exacerbations that prompted referral.  No orthopnea or PND. Notes he does have swelling in his legs. Unsure if the swelling is associated with worsened breathing. Sleeps every night with CPAP. Thinks swelling is a little worse today.  Reviewed CXR dated 11/10/2017 with my interpretation as follows: clear lungs, cardiac silhouette enlarged.  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 CBC    Component Value Date/Time   WBC 8.8 08/15/2019 1458   RBC 3.96 (L) 08/15/2019 1458   HGB 11.5 (L)  08/15/2019 1458   HCT 34.2 (L) 08/15/2019 1458   PLT 146.0 (L) 08/15/2019 1458   MCV 86.3 08/15/2019 1458   MCH 29.0 02/25/2015 1140   MCHC 33.5 08/15/2019 1458   RDW 14.5 08/15/2019 1458   LYMPHSABS 2.4 08/15/2019 1458   MONOABS 0.8 08/15/2019 1458   EOSABS 0.7 08/15/2019 1458   BASOSABS 0.1 08/15/2019 1458    BMET    Component Value Date/Time   NA 142 02/25/2015 1140   K 4.3 02/25/2015 1140   CL 102 02/25/2015 1140   CO2 27 02/25/2015 1140   GLUCOSE 162 (H) 02/25/2015 1140   BUN 20 02/25/2015 1140   CREATININE 1.15 02/25/2015 1140   CALCIUM 9.6 02/25/2015 1140   GFRNONAA 60 (L) 02/25/2015 1140   GFRAA >60 02/25/2015 1140    BNP No results found for: BNP  ProBNP No results found for: PROBNP  Specialty Problems      Pulmonary Problems   COPD with acute exacerbation (HCC)      Allergies  Allergen Reactions  . Beef-Derived Products Anaphylaxis  . Lactose Intolerance (Gi) Anaphylaxis and Diarrhea    Gas  . Pork-Derived Products Anaphylaxis    Immunization History  Administered Date(s) Administered  . Influenza, High Dose Seasonal PF 11/07/2018  . Moderna SARS-COVID-2 Vaccination 03/07/2019, 04/04/2019  . Zoster Recombinat (Shingrix) 07/01/2017    Past Medical History:  Diagnosis Date  . Anxiety   . BPH (benign prostatic hyperplasia)   . Cancer [facility])    prostate --  borderline...only takes meds  psa was elevated  . Depression   . Diabetes mellitus without complication ([place])    dx 1991  . Food allergy    Allergy to alpha-gal  . High cholesterol   . HOH (hard of hearing)   . Hypertension   . Pneumonia   . Sleep apnea     Tobacco History: Social History   Tobacco Use  Smoking Status Former Smoker  . Packs/day: 1.50  . Years: 40.00  . Pack years: 60.00  . Types: Cigarettes  . Quit date: 01/19/1989  . Years since quitting: 30.5  Smokeless Tobacco Never Used   Counseling given: Not Answered   Continue to not smoke  Outpatient Encounter  Medications as of 08/15/2019  Medication Sig  . acetaminophen (TYLENOL) 500 MG tablet Take 1,000 mg by mouth daily as needed for mild pain.  Marland Kitchen albuterol (VENTOLIN HFA) 108 (90 Base) MCG/ACT inhaler SMARTSIG:2 Puff(s) By Mouth Every 4-6 Hours PRN  . ALPRAZolam (XANAX) 0.5 MG tablet Take 0.5 mg by mouth daily as needed for anxiety.  Marland Kitchen aspirin EC 81 MG tablet Take 81 mg by mouth daily.  Marland Kitchen buPROPion (WELLBUTRIN XL) 300 MG 24 hr tablet Take 300 mg by mouth daily.  . cetirizine (ZYRTEC) 10 MG tablet Take 20 mg by mouth daily.  . cyanocobalamin 1000 MCG tablet Take 1,000 mcg by mouth daily.   Marland Kitchen EPINEPHrine 0.3 mg/0.3 mL IJ SOAJ injection Use as directed for life-threatening allergic reaction.  Marland Kitchen escitalopram (LEXAPRO) 10 MG tablet TAKE 1 TABLET BY MOUTH ONCE DAILY FOR ANXIETY  . finasteride (PROSCAR) 5 MG tablet Take 5 mg by mouth daily.  . fluocinonide (LIDEX) 0.05 % external solution APPLY 1 ML TOPICALLY TWICE DAILY  . hydrochlorothiazide (HYDRODIURIL) 25 MG tablet Take 25 mg by mouth daily.  Marland Kitchen losartan (COZAAR) 100 MG tablet Take 100 mg by mouth daily.  Marland Kitchen lovastatin (MEVACOR) 20 MG tablet   . metFORMIN (GLUCOPHAGE) 1000 MG tablet Take 1,000 mg by mouth 2 (two) times daily with a meal.  . NOVOLIN N RELION 100 UNIT/ML injection INJECT 40 TO 50 UNITS SUB Q TWICE DAILY. (TITRATE UP UNTIL FASTING AND BEDTIME GLUCOSE ARE LESS THAN 140)  . Omega-3 Fatty Acids (FISH OIL) 1200 MG CAPS Take 1,200 mg by mouth 2 (two) times daily.  . SYMBICORT 160-4.5 MCG/ACT inhaler   . tamsulosin (FLOMAX) 0.4 MG CAPS capsule Take 0.4 mg by mouth daily.  Marland Kitchen triamcinolone cream (KENALOG) 0.1 % APPLY CREAM TO RASH TWICE DAILY  . [DISCONTINUED] losartan-hydrochlorothiazide (HYZAAR) 100-25 MG tablet Take 1 tablet by mouth daily.  . [DISCONTINUED] sitaGLIPtin (JANUVIA) 100 MG tablet Take 100 mg by mouth daily.  Marland Kitchen azithromycin (ZITHROMAX) 250 MG tablet Take 500 mg (2 tabs) day one, the 250 mg (1 tab) days 2 through 5  .  Budeson-Glycopyrrol-Formoterol (BREZTRI AEROSPHERE) 160-9-4.8 MCG/ACT AERO Inhale 2 puffs into the lungs 2 (two) times daily.  . montelukast (SINGULAIR) 10 MG tablet TAKE ONE TABLET ONCE DAILY (Patient not taking: Reported on 08/15/2019)  . predniSONE (DELTASONE) 20 MG tablet Take 2 tablets (40 mg total) by mouth daily with breakfast for 5 days.  . [DISCONTINUED] loratadine (CLARITIN) 10 MG tablet Take 10 mg by mouth daily. (Patient not taking: Reported on 08/15/2019)  . [DISCONTINUED] predniSONE (DELTASONE) 10 MG tablet TAKE AS DIRECTED SEE ATTACHED NOTE   No facility-administered encounter medications on file as of 08/15/2019.     Review of Systems  Review of Systems  Endorses weight gain, 15-20 pounds since start  of pandemic, no chest pain, no cough when well. Comprehensive review of symptoms otherwise negative.  Physical Exam  BP (!) 138/66 (BP Location: Right Arm, Cuff Size: Normal)   Pulse 76   Temp 98.1 F (36.7 C) (Oral)   Ht 5' 8.5" (1.74 m)   Wt (!) 292 lb 12.8 oz (132.8 kg)   SpO2 96%   BMI 43.87 kg/m   Wt Readings from Last 5 Encounters:  08/15/19 (!) 292 lb 12.8 oz (132.8 kg)  08/03/17 264 lb 12.8 oz (120.1 kg)  03/04/15 258 lb (117 kg)  02/25/15 258 lb 8 oz (117.3 kg)    BMI Readings from Last 5 Encounters:  08/15/19 43.87 kg/m  08/03/17 40.26 kg/m  03/04/15 37.55 kg/m  02/25/15 37.63 kg/m     Physical Exam General: obese, in NAD Eyes: EOMI, no icterus Mouth: MMM, no lesions Respiratory: Decent air movement, significant expiratory wheeze bilaterally worse on left, no accessory muscle use Cardiovascular: RRR, no murmur, 1+ pitting edema to mid shin Abdomen/GI: non tender, BS present Musculoskeletal: No synovitis, no joint deformity Skin: Maculopapular erythematous rash along beard line, mildly pale Neuro: Normal gait, CN intact Psych: AAOx3, normal mood, full affect   Assessment & Plan:   Nathaniel Gray is a 84 year old man with PMH of OSA, insulin  dependent diabetes, seasonal allergies, and reported COPD (no PFTs) who presents with recurrent DOE and cough whom we are seeing in consultation at the request of Cyndi Bender PA-C for evaluation of COPD.  Presumed COPD based on significant smoking history and recurrent episodes of DOE and cough that improve on prednisone and antibiotics. He is having another exacerbation today with increased cough with sputum production and DOE. Appears otherwise well at rest. Prednisone and zpak for exacerbation. Step up symbicort to triple therapy via Breztri. CXR ordered to assess more pna or other intraparenchymal process that could be contributing.  Concerned that with multiple exacerbations in short time frame another process is contributing to breathing difficulties. He appears volume up on exam today. Instructed patient to take his "PRN" lasix the next 3 days, 40 mg daily. Furthermore, query possible underlying asthma as culprit. Will check Eos and IgE today. CBC resulted with abs eos 700 increasing suspicion for asthma overlap.   Will obtain PFTs and 6MWT once at steady state at next return visit.    Return in about 6 weeks (around 09/26/2019).   Lanier Clam, MD 08/15/2019

## 2019-08-16 LAB — IGE: IgE (Immunoglobulin E), Serum: 33 kU/L (ref <=114)

## 2019-09-05 ENCOUNTER — Ambulatory Visit (INDEPENDENT_AMBULATORY_CARE_PROVIDER_SITE_OTHER): Payer: Medicare Other | Admitting: Pulmonary Disease

## 2019-09-05 ENCOUNTER — Other Ambulatory Visit: Payer: Self-pay

## 2019-09-05 DIAGNOSIS — R062 Wheezing: Secondary | ICD-10-CM

## 2019-09-05 DIAGNOSIS — J441 Chronic obstructive pulmonary disease with (acute) exacerbation: Secondary | ICD-10-CM | POA: Diagnosis not present

## 2019-09-05 LAB — PULMONARY FUNCTION TEST
DL/VA % pred: 101 %
DL/VA: 3.95 ml/min/mmHg/L
DLCO cor % pred: 72 %
DLCO cor: 17.02 ml/min/mmHg
DLCO unc % pred: 65 %
DLCO unc: 15.32 ml/min/mmHg
FEF 25-75 Post: 1.2 L/sec
FEF 25-75 Pre: 1.55 L/sec
FEF2575-%Change-Post: -22 %
FEF2575-%Pred-Post: 67 %
FEF2575-%Pred-Pre: 86 %
FEV1-%Change-Post: -4 %
FEV1-%Pred-Post: 68 %
FEV1-%Pred-Pre: 71 %
FEV1-Post: 1.81 L
FEV1-Pre: 1.9 L
FEV1FVC-%Change-Post: 4 %
FEV1FVC-%Pred-Pre: 108 %
FEV6-%Change-Post: -7 %
FEV6-%Pred-Post: 64 %
FEV6-%Pred-Pre: 69 %
FEV6-Post: 2.25 L
FEV6-Pre: 2.43 L
FEV6FVC-%Change-Post: 1 %
FEV6FVC-%Pred-Post: 107 %
FEV6FVC-%Pred-Pre: 106 %
FVC-%Change-Post: -8 %
FVC-%Pred-Post: 59 %
FVC-%Pred-Pre: 65 %
FVC-Post: 2.25 L
FVC-Pre: 2.46 L
Post FEV1/FVC ratio: 80 %
Post FEV6/FVC ratio: 100 %
Pre FEV1/FVC ratio: 77 %
Pre FEV6/FVC Ratio: 99 %
RV % pred: 100 %
RV: 2.65 L
TLC % pred: 73 %
TLC: 5.02 L

## 2019-09-05 NOTE — Progress Notes (Signed)
PFT done today. 

## 2019-09-13 ENCOUNTER — Ambulatory Visit: Payer: Medicare Other | Admitting: Pulmonary Disease

## 2019-09-13 ENCOUNTER — Other Ambulatory Visit: Payer: Self-pay

## 2019-09-13 ENCOUNTER — Encounter: Payer: Self-pay | Admitting: Pulmonary Disease

## 2019-09-13 ENCOUNTER — Ambulatory Visit (INDEPENDENT_AMBULATORY_CARE_PROVIDER_SITE_OTHER): Payer: Medicare Other

## 2019-09-13 VITALS — BP 120/70 | HR 69 | Temp 98.0°F | Ht 69.0 in | Wt 297.2 lb

## 2019-09-13 DIAGNOSIS — J449 Chronic obstructive pulmonary disease, unspecified: Secondary | ICD-10-CM

## 2019-09-13 DIAGNOSIS — R0609 Other forms of dyspnea: Secondary | ICD-10-CM

## 2019-09-13 DIAGNOSIS — J441 Chronic obstructive pulmonary disease with (acute) exacerbation: Secondary | ICD-10-CM | POA: Diagnosis not present

## 2019-09-13 DIAGNOSIS — G4733 Obstructive sleep apnea (adult) (pediatric): Secondary | ICD-10-CM | POA: Diagnosis not present

## 2019-09-13 DIAGNOSIS — R0600 Dyspnea, unspecified: Secondary | ICD-10-CM | POA: Diagnosis not present

## 2019-09-13 DIAGNOSIS — J8283 Eosinophilic asthma: Secondary | ICD-10-CM

## 2019-09-13 MED ORDER — BREZTRI AEROSPHERE 160-9-4.8 MCG/ACT IN AERO: 2.0000 | INHALATION_SPRAY | Freq: Two times a day (BID) | RESPIRATORY_TRACT | 11 refills | Status: DC

## 2019-09-13 MED ORDER — EPINEPHRINE 0.3 MG/0.3ML IJ SOAJ: 0.3000 mg | INTRAMUSCULAR | 3 refills | Status: AC | PRN

## 2019-09-13 NOTE — Progress Notes (Signed)
Patient ID: Nathaniel Rajan., male    DOB: July 03, 1935, 84 y.o.   MRN: [id number]  Chief Complaint  Patient presents with  . Follow-up    Patient did 6 minute walk and was not able to complete. Shortness of breath with exertion, cough with white    Referring provider: Helen Hashimoto., MD  HPI:  Nathaniel Gray is a 84-year-old man with OSA, insulin-dependent diabetes, history of tobacco abuse (60-80-pack-year history) who resolve with recurrent wheeze and dyspnea on exertion.  Recurrent exacerbations requiring prednisone.  Further work-up demonstrated mixed restrictive (obesity)/obstructive physiology (COPD/asthma) with pseudonormalization of the FEV1/FVC ratio with moderate restriction on lung volumes with severely reduced ERV as well as evidence of gas trapping with elevated RV/TLC ratio.  Labs notable for elevated eosinophil count of 700.  PMH:  Smoker/ Smoking History:  Maintenance:   Pt of:   09/13/2019  - Visit  Today he is feeling relatively well.  Shortness of breath and cough markedly improved after recent steroid burst prescribed by me 08/15/2019.  Using respiratory as prescribed with improvement in daily symptoms.  Feels that his symptoms have slightly worsened over time as he is getting farther away from prednisone use.  Again however overall much improved.  Discussed at length his PFTs with mixed restrictive and obstructive physiology likely a combination of obesity as well as COPD due to his past tobacco abuse as well as my concern for undiagnosed asthma given his recurrent exacerbations and elevated eosinophils.   Questionaires / Pulmonary Flowsheets:   ACT:  No flowsheet data found.  MMRC: No flowsheet data found.  Epworth:  No flowsheet data found.  Tests:   FENO:  No results found for: NITRICOXIDE  PFT: Interpreted as mixed obstructive with pseudonormalization of FEV1 to FVC ratio as well as restrictive physiology given moderately decreased TLC, RV to TLC ratio  of 133% predicted suggestive of gas trapping.  DLCO was moderately reduced. PFT Results Latest Ref Rng & Units 09/05/2019  FVC-Pre L 2.46  FVC-Predicted Pre % 65  FVC-Post L 2.25  FVC-Predicted Post % 59  Pre FEV1/FVC % % 77  Post FEV1/FCV % % 80  FEV1-Pre L 1.90  FEV1-Predicted Pre % 71  FEV1-Post L 1.81  DLCO uncorrected ml/min/mmHg 15.32  DLCO UNC% % 65  DLCO corrected ml/min/mmHg 17.02  DLCO COR %Predicted % 72  DLVA Predicted % 101  TLC L 5.02  TLC % Predicted % 73  RV % Predicted % 100    WALK:  Interpretation as follows: Patient ambulated 272 m.  He had to stop in the middle of the test due to back pain shortness of breath and eventually end of the walk early due to low back pain.  At the start of test, heart rate 69, BP 120/70, O2 sat 98 percent.  Oxygen nadir to 90% at end of test.  At end of test blood pressure 140/90, heart rate 94.  Upon resting, heart rate 82, blood pressure 130/84, oxygen saturation 97%.  Test demonstrates normal increase in heart rate and blood pressure but is abnormal with decrease in oxygen saturation to 90%.  No supplemental oxygen required during test. SIX MIN WALK 09/13/2019  Medications Breztri inhaler 160-9-4.31mcg and Metformin 1000mg  at 8:30  Supplimental Oxygen during Test? (L/min) No  Laps 8  Partial Lap (in Meters) 0  Baseline BP (sitting) 120/70  Baseline Heartrate 69  Baseline Dyspnea (Borg Scale) 0  Baseline Fatigue (Borg Scale) 0  Baseline SPO2 98  BP (  sitting) 140/90  Heartrate 94  Dyspnea (Borg Scale) 4  Fatigue (Borg Scale) 1  SPO2 90  BP (sitting) 130/84  Heartrate 82  SPO2 97  Stopped or Paused before Six Minutes Yes  Other Symptoms at end of Exercise Patient had to pause with 79min 27sec remaining for 55minutes due to back pain. Patient then had to fully stop walk with 1 min 40 sec remaining due to back pain and SOB  Distance Completed 272  Distance Completed 0  Tech Comments: Pt walked at a moderate pace having complaints  of back pain. Pt was not able to complete entire 6 minutes due to back pain and complaints of SOB.    Imaging: DG Chest 2 View  Result Date: 08/16/2019 CLINICAL DATA:  Shortness of breath EXAM: CHEST - 2 VIEW COMPARISON:  11/09/2017 FINDINGS: Cardiomegaly. Mild elevation of the right hemidiaphragm, stable. No confluent opacities or effusions. Mild peribronchial thickening. No acute bony abnormality. IMPRESSION: Mild bronchitic changes. Electronically Signed   By: Rolm Baptise M.D.   On: 08/16/2019 09:53    Lab Results: Personally reviewed CBC    Component Value Date/Time   WBC 8.8 08/15/2019 1458   RBC 3.96 (L) 08/15/2019 1458   HGB 11.5 (L) 08/15/2019 1458   HCT 34.2 (L) 08/15/2019 1458   PLT 146.0 (L) 08/15/2019 1458   MCV 86.3 08/15/2019 1458   MCH 29.0 02/25/2015 1140   MCHC 33.5 08/15/2019 1458   RDW 14.5 08/15/2019 1458   LYMPHSABS 2.4 08/15/2019 1458   MONOABS 0.8 08/15/2019 1458   EOSABS 0.7 08/15/2019 1458   BASOSABS 0.1 08/15/2019 1458    BMET    Component Value Date/Time   NA 142 02/25/2015 1140   K 4.3 02/25/2015 1140   CL 102 02/25/2015 1140   CO2 27 02/25/2015 1140   GLUCOSE 162 (H) 02/25/2015 1140   BUN 20 02/25/2015 1140   CREATININE 1.15 02/25/2015 1140   CALCIUM 9.6 02/25/2015 1140   GFRNONAA 60 (L) 02/25/2015 1140   GFRAA >60 02/25/2015 1140    BNP No results found for: BNP  ProBNP No results found for: PROBNP  Specialty Problems      Pulmonary Problems   COPD with acute exacerbation (HCC)   OSA and COPD overlap syndrome (HCC)      Allergies  Allergen Reactions  . Beef-Derived Products Anaphylaxis  . Lactose Intolerance (Gi) Anaphylaxis and Diarrhea    Gas  . Pork-Derived Products Anaphylaxis    Immunization History  Administered Date(s) Administered  . Influenza, High Dose Seasonal PF 11/07/2018  . Moderna SARS-COVID-2 Vaccination 03/07/2019, 04/04/2019  . Zoster Recombinat (Shingrix) 07/01/2017    Past Medical History:    Diagnosis Date  . Anxiety   . BPH (benign prostatic hyperplasia)   . Cancer [facility])    prostate --  borderline...only takes meds   psa was elevated  . Depression   . Diabetes mellitus without complication ([place])    dx 1991  . Food allergy    Allergy to alpha-gal  . High cholesterol   . HOH (hard of hearing)   . Hypertension   . Pneumonia   . Sleep apnea     Tobacco History: Social History   Tobacco Use  Smoking Status Former Smoker  . Packs/day: 1.50  . Years: 40.00  . Pack years: 60.00  . Types: Cigarettes  . Quit date: 01/19/1989  . Years since quitting: 30.6  Smokeless Tobacco Never Used   Counseling given: Not Answered   Continue  to not smoke  Outpatient Encounter Medications as of 09/13/2019  Medication Sig  . acetaminophen (TYLENOL) 500 MG tablet Take 1,000 mg by mouth daily as needed for mild pain.  Marland Kitchen albuterol (VENTOLIN HFA) 108 (90 Base) MCG/ACT inhaler SMARTSIG:2 Puff(s) By Mouth Every 4-6 Hours PRN  . ALPRAZolam (XANAX) 0.5 MG tablet Take 0.5 mg by mouth daily as needed for anxiety.  Marland Kitchen aspirin EC 81 MG tablet Take 81 mg by mouth daily.  Marland Kitchen azithromycin (ZITHROMAX) 250 MG tablet Take 500 mg (2 tabs) day one, the 250 mg (1 tab) days 2 through 5  . Budeson-Glycopyrrol-Formoterol (BREZTRI AEROSPHERE) 160-9-4.8 MCG/ACT AERO Inhale 2 puffs into the lungs 2 (two) times daily.  Marland Kitchen buPROPion (WELLBUTRIN XL) 300 MG 24 hr tablet Take 300 mg by mouth daily.  . cetirizine (ZYRTEC) 10 MG tablet Take 20 mg by mouth daily.  . cyanocobalamin 1000 MCG tablet Take 1,000 mcg by mouth daily.   Marland Kitchen EPINEPHrine 0.3 mg/0.3 mL IJ SOAJ injection Use as directed for life-threatening allergic reaction.  Marland Kitchen escitalopram (LEXAPRO) 10 MG tablet TAKE 1 TABLET BY MOUTH ONCE DAILY FOR ANXIETY  . finasteride (PROSCAR) 5 MG tablet Take 5 mg by mouth daily.  . fluocinonide (LIDEX) 0.05 % external solution APPLY 1 ML TOPICALLY TWICE DAILY  . hydrochlorothiazide (HYDRODIURIL) 25 MG tablet Take 25 mg by  mouth daily.  Marland Kitchen losartan (COZAAR) 100 MG tablet Take 100 mg by mouth daily.  Marland Kitchen lovastatin (MEVACOR) 20 MG tablet   . metFORMIN (GLUCOPHAGE) 1000 MG tablet Take 500 mg by mouth daily with breakfast.   . montelukast (SINGULAIR) 10 MG tablet TAKE ONE TABLET ONCE DAILY  . NOVOLIN N RELION 100 UNIT/ML injection INJECT 40 TO 50 UNITS SUB Q TWICE DAILY. (TITRATE UP UNTIL FASTING AND BEDTIME GLUCOSE ARE LESS THAN 140)  . Omega-3 Fatty Acids (FISH OIL) 1200 MG CAPS Take 1,200 mg by mouth 2 (two) times daily.  . tamsulosin (FLOMAX) 0.4 MG CAPS capsule Take 0.4 mg by mouth daily.  Marland Kitchen triamcinolone cream (KENALOG) 0.1 % APPLY CREAM TO RASH TWICE DAILY  . [DISCONTINUED] Budeson-Glycopyrrol-Formoterol (BREZTRI AEROSPHERE) 160-9-4.8 MCG/ACT AERO Inhale 2 puffs into the lungs 2 (two) times daily.   No facility-administered encounter medications on file as of 09/13/2019.     Review of Systems  Review of Systems  No chest pain.  Wheezing a bit back to last several days but not as bad as baseline.  Comprehensive review of systems otherwise negative. Physical Exam  BP 120/70 (BP Location: Left Arm, Patient Position: Sitting, Cuff Size: Large)   Pulse 69   Temp 98 F (36.7 C) (Temporal)   Ht 5\' 9"  (1.753 m)   Wt 297 lb 3.2 oz (134.8 kg)   SpO2 98%   BMI 43.89 kg/m   Wt Readings from Last 5 Encounters:  09/13/19 297 lb 3.2 oz (134.8 kg)  08/15/19 (!) 292 lb 12.8 oz (132.8 kg)  08/03/17 264 lb 12.8 oz (120.1 kg)  03/04/15 258 lb (117 kg)  02/25/15 258 lb 8 oz (117.3 kg)    BMI Readings from Last 5 Encounters:  09/13/19 43.89 kg/m  08/15/19 43.87 kg/m  08/03/17 40.26 kg/m  03/04/15 37.55 kg/m  02/25/15 37.63 kg/m     Physical Exam General: Obese, no acute distress Respiratory: Isolated wheezing on the right, clear to auscultation on the left, good air movement Abdomen: soft, bowel sounds present Neuro: Normal gait, no weakness   Assessment & Plan:    Nathaniel Gray is  a 36-year-old man  with OSA, insulin-dependent diabetes, history of tobacco abuse (60-80-pack-year history) who resolve with recurrent wheeze and dyspnea on exertion.  Recurrent exacerbations requiring prednisone.  Further work-up demonstrated mixed restrictive (obesity)/obstructive physiology (COPD/asthma) with pseudonormalization of the FEV1/FVC ratio with moderate restriction on lung volumes with severely reduced ERV as well as evidence of gas trapping with elevated RV/TLC ratio.  Labs notable for elevated eosinophil count of 700.  Feel his symptoms reflect the combination of restrictive ventilatory defect in the setting obesity but in large part due to likely underlying COPD in the setting of tobacco abuse and undiagnosed asthma with eosinophilic phenotype.  Suspect cough related to underlying asthma/COPD.  Encourage he is doing a bit better on triple inhaled therapy.  To continue this via breztr.  Prescription provided today.  Given recurrent exacerbations and eosinophilic phenotype, will start process for obtaining Nucala injections as suspect he will benefit greatly from this in effort to prevent exacerbations and minimize prednisone use.  Counseled extensively on the benefits of this medication.  Agreed to move forward.  EpiPen sent to pharmacy as well.  Return in about 3 months (around 12/14/2019).   Lanier Clam, MD 09/13/2019   This appointment required 32 minutes of patient care (this includes precharting, chart review, review of results, face-to-face care, etc.).

## 2019-09-13 NOTE — Patient Instructions (Addendum)
Nice to see you again!  We will start the process of getting you an injectable drug called mepolizumab (brand name Nucala) to treat eosinophilic asthma.  I sent a prescription for Breztri and epipen to [facility]. Let me know if it is too expensive.   We will see you back in 3 months with Dr. Silas Flood.

## 2019-09-13 NOTE — Progress Notes (Signed)
Six Minute Walk - 09/13/19 1523      Six Minute Walk   Medications taken before test (dose and time) Breztri inhaler 160-9-4.68mcg and Metformin 1000mg  at 8:30    Supplemental oxygen during test? No    Lap distance in meters  34 meters    Laps Completed 8    Partial lap (in meters) 0 meters    Baseline BP (sitting) 120/70    Baseline Heartrate 69    Baseline Dyspnea (Borg Scale) 0    Baseline Fatigue (Borg Scale) 0    Baseline SPO2 98 %      End of Test Values    BP (sitting) 140/90    Heartrate 94    Dyspnea (Borg Scale) 4    Fatigue (Borg Scale) 1    SPO2 90 %      2 Minutes Post Walk Values   BP (sitting) 130/84    Heartrate 82    SPO2 97 %    Stopped or paused before six minutes? Yes    Reason: Patient had to pause with 38min 27sec remaining for 29minutes due to back pain. Patient then had to fully stop walk with 1 min 40 sec remaining due to back pain and SOB      Interpretation   Distance completed 272 meters    Tech Comments: Pt walked at a moderate pace having complaints of back pain. Pt was not able to complete entire 6 minutes due to back pain and complaints of SOB.

## 2019-09-14 ENCOUNTER — Telehealth: Payer: Self-pay | Admitting: Pharmacy Technician

## 2019-09-14 NOTE — Telephone Encounter (Signed)
Received New start paperwork for [place]. Will update as we work through the benefits process.  Received notification from [facility] regarding a prior authorization for [place]. Authorization has been APPROVED from 09/14/19 to 03/16/20.   Authorization #  [id number]  Ran test claim, patient's copay for 1 month of Nucala is $995.64. Faxed new start documents to [facility] [place].  Phone# [phone]  10:24 AM Beatriz Chancellor, CPhT

## 2019-09-19 NOTE — Telephone Encounter (Signed)
Received a fax from  [place] to [place] regarding an approval for [place] patient assistance from 09/15/19 to 01/19/20.   For Self-administration, medication will ship from [facility].  Phone number:[phone]

## 2019-10-02 ENCOUNTER — Telehealth: Payer: Self-pay | Admitting: Pulmonary Disease

## 2019-10-02 NOTE — Telephone Encounter (Signed)
Called and spoke with pt's wife Tye Maryland who stated that pt has received the nucala and is needing to get pt scheduled for an appt.  Routing to injection pool.

## 2019-10-09 NOTE — Telephone Encounter (Signed)
Tye Maryland wife states patient would like Nucala injection on 10/13/2019 after 1:00 pm. Tye Maryland phone number is [phone].

## 2019-10-10 NOTE — Telephone Encounter (Signed)
Called and spoke with Patient's wife, Tye Maryland. Patient has received Nucala delivery, and has Epipen.  Cathy requested to come to appointment, so she may also learn how to do Nucala injections.  Patient scheduled 10/06/19, at 1330. Office protocol reviewed, epi pen, and 2 hour monitoring. Understanding stated.  Nothing further at this time.

## 2019-10-13 ENCOUNTER — Ambulatory Visit (INDEPENDENT_AMBULATORY_CARE_PROVIDER_SITE_OTHER): Payer: Medicare Other

## 2019-10-13 ENCOUNTER — Other Ambulatory Visit: Payer: Self-pay

## 2019-10-13 DIAGNOSIS — J8283 Eosinophilic asthma: Secondary | ICD-10-CM | POA: Diagnosis not present

## 2019-10-13 MED ORDER — MEPOLIZUMAB 100 MG/ML ~~LOC~~ SOSY
100.0000 mg | PREFILLED_SYRINGE | Freq: Once | SUBCUTANEOUS | Status: AC
  Administered 2019-10-13: 100 mg via SUBCUTANEOUS

## 2019-10-13 NOTE — Progress Notes (Signed)
Patient presented to the office today for first-time Nucala injection.  Primary Pulmonologist:  Dr Brynda Peon Medication name: Geradine Girt Strength: 100mg  Site(s): Left arm   Epi pen/Auvi-Q visible during appointment: Yes  Time of injection: 1330  Patient evaluated every 15-20 minutes per protocol x2 hours.  1st check: 1345 Evaluation:Patient sitting with wife.  Patient denies any side or adverse effects.  No redness or swelling at injection site.  2nd check: 1400  Evaluation:Patient sitting with Wife.  Denies any problems.  3rd check: 1415  Evaluation:Patient sitting with wife.  Patient denies any problems.  No redness or swelling at injection site.  4th check: 1430   Evaluation:Patient sitting with Wife. Patient denies any problems.   5th check: 1445  Evaluation:Patient sitting with Wife.  Patient denies any problems.  No redness or swelling at injection site.  6th check: 1500  Evaluation: Patient sitting with Wife at side.  Patient denies any problems.  7th check: 1515  Evaluation: Patient denies any side or adverse effects.  No redness or swelling at injection site.       Patient came to office for teaching self administration of Nucala. Patient requested his Wife learn and give injection.  Patient and Patient's Wife shown how to administer Nucala pen with demo.  Patient and Patient's Wife practiced and demonstrated injection with demo pen. Signs of adverse effects, and what to do in emergency situation reviewed.  Epi pen demonstration given with Epi pen demo pen. Patient's wife administered Nucala injection into left arm.  Questions answered. Advised to call office with any questions or concerns, and to follow up with Dr Silas Flood in 3 months.

## 2019-12-08 ENCOUNTER — Ambulatory Visit: Payer: Medicare Other | Admitting: Pulmonary Disease

## 2019-12-08 ENCOUNTER — Other Ambulatory Visit: Payer: Self-pay

## 2019-12-08 ENCOUNTER — Encounter: Payer: Self-pay | Admitting: Pulmonary Disease

## 2019-12-08 VITALS — BP 126/80 | HR 58 | Temp 97.3°F | Wt 293.4 lb

## 2019-12-08 DIAGNOSIS — G4733 Obstructive sleep apnea (adult) (pediatric): Secondary | ICD-10-CM

## 2019-12-08 DIAGNOSIS — J449 Chronic obstructive pulmonary disease, unspecified: Secondary | ICD-10-CM

## 2019-12-08 DIAGNOSIS — J4520 Mild intermittent asthma, uncomplicated: Secondary | ICD-10-CM | POA: Diagnosis not present

## 2019-12-08 MED ORDER — BREZTRI AEROSPHERE 160-9-4.8 MCG/ACT IN AERO: 2.0000 | INHALATION_SPRAY | Freq: Two times a day (BID) | RESPIRATORY_TRACT | 0 refills | Status: DC

## 2019-12-08 NOTE — Progress Notes (Signed)
Patient ID: Paxon Propes., male    DOB: 06-Apr-1935, 84 y.o.   MRN: [id number]  Chief Complaint  Patient presents with  . Follow-up    pt stated that he is doing better.  the mask causes some SHOB when he is out and he stated that his nose has started to run more    Referring provider: Cyndi Bender, PA-C  HPI:  Mr. Elsass is a 84 year old man with OSA, insulin-dependent diabetes, history of tobacco abuse (60-80-pack-year history) who resolve with recurrent wheeze and dyspnea on exertion.  Recurrent exacerbations requiring prednisone.  Further work-up demonstrated mixed restrictive (obesity)/obstructive physiology (COPD/asthma) with pseudonormalization of the FEV1/FVC ratio with moderate restriction on lung volumes with severely reduced ERV as well as evidence of gas trapping with elevated RV/TLC ratio.  Labs notable for elevated eosinophil count of 700.   12/08/2019  - Visit  Today he is feeling well.  Breathing much improved. No issues with nucala. Current dose is a bit late but he isn't due for injection until next week. Notes a bit more runny nose, unsure if this is related. Seems to be sleeping better, more rested.   Questionaires / Pulmonary Flowsheets:   ACT:  No flowsheet data found.  MMRC: No flowsheet data found.  Epworth:  No flowsheet data found.  Tests:   FENO:  No results found for: NITRICOXIDE  PFT: Interpreted as mixed obstructive with pseudonormalization of FEV1 to FVC ratio as well as restrictive physiology given moderately decreased TLC, RV to TLC ratio of 133% predicted suggestive of gas trapping.  DLCO was moderately reduced. PFT Results Latest Ref Rng & Units 09/05/2019  FVC-Pre L 2.46  FVC-Predicted Pre % 65  FVC-Post L 2.25  FVC-Predicted Post % 59  Pre FEV1/FVC % % 77  Post FEV1/FCV % % 80  FEV1-Pre L 1.90  FEV1-Predicted Pre % 71  FEV1-Post L 1.81  DLCO uncorrected ml/min/mmHg 15.32  DLCO UNC% % 65  DLCO corrected ml/min/mmHg 17.02  DLCO  COR %Predicted % 72  DLVA Predicted % 101  TLC L 5.02  TLC % Predicted % 73  RV % Predicted % 100    WALK:  Interpretation as follows: Patient ambulated 272 m.  He had to stop in the middle of the test due to back pain shortness of breath and eventually end of the walk early due to low back pain.  At the start of test, heart rate 69, BP 120/70, O2 sat 98 percent.  Oxygen nadir to 90% at end of test.  At end of test blood pressure 140/90, heart rate 94.  Upon resting, heart rate 82, blood pressure 130/84, oxygen saturation 97%.  Test demonstrates normal increase in heart rate and blood pressure but is abnormal with decrease in oxygen saturation to 90%.  No supplemental oxygen required during test. SIX MIN WALK 09/13/2019  Medications Breztri inhaler 160-9-4.5mcg and Metformin 1000mg  at 8:30  Supplimental Oxygen during Test? (L/min) No  Laps 8  Partial Lap (in Meters) 0  Baseline BP (sitting) 120/70  Baseline Heartrate 69  Baseline Dyspnea (Borg Scale) 0  Baseline Fatigue (Borg Scale) 0  Baseline SPO2 98  BP (sitting) 140/90  Heartrate 94  Dyspnea (Borg Scale) 4  Fatigue (Borg Scale) 1  SPO2 90  BP (sitting) 130/84  Heartrate 82  SPO2 97  Stopped or Paused before Six Minutes Yes  Other Symptoms at end of Exercise Patient had to pause with 11min 27sec remaining for 44minutes due to back pain.  Patient then had to fully stop walk with 1 min 40 sec remaining due to back pain and SOB  Distance Completed 272  Distance Completed 0  Tech Comments: Pt walked at a moderate pace having complaints of back pain. Pt was not able to complete entire 6 minutes due to back pain and complaints of SOB.    Imaging: No results found.  Lab Results: Personally reviewed CBC    Component Value Date/Time   WBC 8.8 08/15/2019 1458   RBC 3.96 (L) 08/15/2019 1458   HGB 11.5 (L) 08/15/2019 1458   HCT 34.2 (L) 08/15/2019 1458   PLT 146.0 (L) 08/15/2019 1458   MCV 86.3 08/15/2019 1458   MCH 29.0 02/25/2015  1140   MCHC 33.5 08/15/2019 1458   RDW 14.5 08/15/2019 1458   LYMPHSABS 2.4 08/15/2019 1458   MONOABS 0.8 08/15/2019 1458   EOSABS 0.7 08/15/2019 1458   BASOSABS 0.1 08/15/2019 1458    BMET    Component Value Date/Time   NA 142 02/25/2015 1140   K 4.3 02/25/2015 1140   CL 102 02/25/2015 1140   CO2 27 02/25/2015 1140   GLUCOSE 162 (H) 02/25/2015 1140   BUN 20 02/25/2015 1140   CREATININE 1.15 02/25/2015 1140   CALCIUM 9.6 02/25/2015 1140   GFRNONAA 60 (L) 02/25/2015 1140   GFRAA >60 02/25/2015 1140    BNP No results found for: BNP  ProBNP No results found for: PROBNP  Specialty Problems      Pulmonary Problems   COPD with acute exacerbation (HCC)   OSA and COPD overlap syndrome (HCC)      Allergies  Allergen Reactions  . Beef-Derived Products Anaphylaxis  . Lactose Intolerance (Gi) Anaphylaxis and Diarrhea    Gas  . Pork-Derived Products Anaphylaxis    Immunization History  Administered Date(s) Administered  . Influenza, High Dose Seasonal PF 11/07/2018  . Moderna SARS-COVID-2 Vaccination 03/07/2019, 04/04/2019  . Zoster Recombinat (Shingrix) 07/01/2017    Past Medical History:  Diagnosis Date  . Anxiety   . BPH (benign prostatic hyperplasia)   . Cancer [facility])    prostate --  borderline...only takes meds   psa was elevated  . Depression   . Diabetes mellitus without complication ([place])    dx 1991  . Food allergy    Allergy to alpha-gal  . High cholesterol   . HOH (hard of hearing)   . Hypertension   . Pneumonia   . Sleep apnea     Tobacco History: Social History   Tobacco Use  Smoking Status Former Smoker  . Packs/day: 1.50  . Years: 40.00  . Pack years: 60.00  . Types: Cigarettes  . Quit date: 01/19/1989  . Years since quitting: 30.9  Smokeless Tobacco Never Used   Counseling given: Not Answered   Continue to not smoke  Outpatient Encounter Medications as of 12/08/2019  Medication Sig  . acetaminophen (TYLENOL) 500 MG tablet Take  1,000 mg by mouth daily as needed for mild pain.  Marland Kitchen albuterol (VENTOLIN HFA) 108 (90 Base) MCG/ACT inhaler SMARTSIG:2 Puff(s) By Mouth Every 4-6 Hours PRN  . ALPRAZolam (XANAX) 0.5 MG tablet Take 0.5 mg by mouth daily as needed for anxiety.  Marland Kitchen aspirin EC 81 MG tablet Take 81 mg by mouth daily.  . Budeson-Glycopyrrol-Formoterol (BREZTRI AEROSPHERE) 160-9-4.8 MCG/ACT AERO Inhale 2 puffs into the lungs 2 (two) times daily.  Marland Kitchen buPROPion (WELLBUTRIN XL) 300 MG 24 hr tablet Take 300 mg by mouth daily.  . cetirizine (ZYRTEC) 10  MG tablet Take 20 mg by mouth daily.  . cyanocobalamin 1000 MCG tablet Take 1,000 mcg by mouth daily.   Marland Kitchen EPINEPHrine (EPIPEN 2-PAK) 0.3 mg/0.3 mL IJ SOAJ injection Inject 0.3 mLs (0.3 mg total) into the muscle as needed for anaphylaxis.  Marland Kitchen escitalopram (LEXAPRO) 10 MG tablet TAKE 1 TABLET BY MOUTH ONCE DAILY FOR ANXIETY  . finasteride (PROSCAR) 5 MG tablet Take 5 mg by mouth daily.  . fluocinonide (LIDEX) 0.05 % external solution APPLY 1 ML TOPICALLY TWICE DAILY  . hydrochlorothiazide (HYDRODIURIL) 25 MG tablet Take 25 mg by mouth daily.  Marland Kitchen losartan (COZAAR) 100 MG tablet Take 100 mg by mouth daily.  Marland Kitchen lovastatin (MEVACOR) 20 MG tablet   . metFORMIN (GLUCOPHAGE) 1000 MG tablet Take 500 mg by mouth daily with breakfast.   . montelukast (SINGULAIR) 10 MG tablet TAKE ONE TABLET ONCE DAILY  . NOVOLIN N RELION 100 UNIT/ML injection INJECT 40 TO 50 UNITS SUB Q TWICE DAILY. (TITRATE UP UNTIL FASTING AND BEDTIME GLUCOSE ARE LESS THAN 140)  . Omega-3 Fatty Acids (FISH OIL) 1200 MG CAPS Take 1,200 mg by mouth 2 (two) times daily.  . tamsulosin (FLOMAX) 0.4 MG CAPS capsule Take 0.4 mg by mouth daily.  Marland Kitchen triamcinolone cream (KENALOG) 0.1 % APPLY CREAM TO RASH TWICE DAILY  . [DISCONTINUED] azithromycin (ZITHROMAX) 250 MG tablet Take 500 mg (2 tabs) day one, the 250 mg (1 tab) days 2 through 5 (Patient not taking: Reported on 12/08/2019)   No facility-administered encounter medications on  file as of 12/08/2019.     Review of Systems  Review of Systems  No chest pain.  Wheezing a bit back to last several days but not as bad as baseline.  Comprehensive review of systems otherwise negative. Physical Exam  BP 126/80   Pulse (!) 58   Temp (!) 97.3 F (36.3 C) (Tympanic)   Wt 293 lb 6 oz (133.1 kg)   SpO2 98%   BMI 43.32 kg/m   Wt Readings from Last 5 Encounters:  12/08/19 293 lb 6 oz (133.1 kg)  09/13/19 297 lb 3.2 oz (134.8 kg)  08/15/19 (!) 292 lb 12.8 oz (132.8 kg)  08/03/17 264 lb 12.8 oz (120.1 kg)  03/04/15 258 lb (117 kg)    BMI Readings from Last 5 Encounters:  12/08/19 43.32 kg/m  09/13/19 43.89 kg/m  08/15/19 43.87 kg/m  08/03/17 40.26 kg/m  03/04/15 37.55 kg/m     Physical Exam General: Obese, no acute distress Respiratory:clear to auscultation, good air movement Abdomen: soft, bowel sounds present Neuro: Normal gait, no weakness   Assessment & Plan:    Mr. Westberg is a 84 year old man with OSA, insulin-dependent diabetes, history of tobacco abuse (60-80-pack-year history) who resolve with recurrent wheeze and dyspnea on exertion.  Recurrent exacerbations requiring prednisone.  Further work-up demonstrated mixed restrictive (obesity)/obstructive physiology (COPD/asthma) with pseudonormalization of the FEV1/FVC ratio with moderate restriction on lung volumes with severely reduced ERV as well as evidence of gas trapping with elevated RV/TLC ratio.  Labs notable for elevated eosinophil count of 700.  Feel his symptoms reflect the combination of restrictive ventilatory defect in the setting obesity but in large part due to likely underlying COPD in the setting of tobacco abuse and undiagnosed asthma with eosinophilic phenotype.  Suspect cough related to underlying asthma/COPD.  Improved  on triple inhaled therapy. Given recurrent exacerbations and eosinophilic phenotype, started nucala 10/13/19. Continues on tripled inhaled therapy Judithann Sauger).  Breathing is stable, improved.   Return in  about 6 months (around 06/06/2020).   Lanier Clam, MD 12/08/2019

## 2019-12-08 NOTE — Addendum Note (Signed)
Addended by: Elie Confer on: 12/08/2019 04:04 PM   Modules accepted: Orders

## 2019-12-08 NOTE — Patient Instructions (Addendum)
Nice to see you!  I am glad the breathing is doing well.  Continue the inhaler and injections.  Let me know if any shipments are late.  Come back to clinic for follow up in 6 months.

## 2019-12-11 ENCOUNTER — Encounter (INDEPENDENT_AMBULATORY_CARE_PROVIDER_SITE_OTHER): Payer: Self-pay | Admitting: Ophthalmology

## 2019-12-11 ENCOUNTER — Ambulatory Visit (INDEPENDENT_AMBULATORY_CARE_PROVIDER_SITE_OTHER): Payer: Medicare Other | Admitting: Ophthalmology

## 2019-12-11 ENCOUNTER — Other Ambulatory Visit: Payer: Self-pay

## 2019-12-11 DIAGNOSIS — E113532 Type 2 diabetes mellitus with proliferative diabetic retinopathy with traction retinal detachment not involving the macula, left eye: Secondary | ICD-10-CM | POA: Diagnosis not present

## 2019-12-11 DIAGNOSIS — E113491 Type 2 diabetes mellitus with severe nonproliferative diabetic retinopathy without macular edema, right eye: Secondary | ICD-10-CM

## 2019-12-11 DIAGNOSIS — Z794 Long term (current) use of insulin: Secondary | ICD-10-CM | POA: Diagnosis not present

## 2019-12-11 DIAGNOSIS — E113552 Type 2 diabetes mellitus with stable proliferative diabetic retinopathy, left eye: Secondary | ICD-10-CM

## 2019-12-11 DIAGNOSIS — H35372 Puckering of macula, left eye: Secondary | ICD-10-CM | POA: Diagnosis not present

## 2019-12-11 NOTE — Assessment & Plan Note (Signed)
The nature of macular pucker (epiretinal membrane ERM) was discussed with the patient as well as threshold criteria for vitrectomy surgery. I explained that in rare cases another surgery is needed to actually remove a second wrinkle should it regrow.  Most often, the epiretinal membrane and underlying wrinkled internal limiting membrane are removed with the first surgery, to accomplish the goals.   If the operative eye is Phakic (natural lens still present), cataract surgery is often recommended prior to Vitrectomy. This will enable the retina surgeon to have the best view during surgery and the patient to obtain optimal results in the future. Treatment options were discussed.  Slight increase in retinal thickening, with good acuity will observe

## 2019-12-11 NOTE — Progress Notes (Signed)
12/11/2019     CHIEF COMPLAINT Patient presents for Retina Follow Up   HISTORY OF PRESENT ILLNESS: Nathaniel W Aldair Rickel. is a 84 y.o. male who presents to the clinic today for:   HPI    Retina Follow Up    Patient presents with  Other.  In left eye.  This started 6 months ago.  Severity is mild.  Duration of 6 months.  Since onset it is stable.          Comments    6 Month F/U OU  Pt denies noticeable changes to [place] OU since last visit. Pt denies ocular pain, flashes of light, or floaters OU.  A1c: "8 something", 09/2019 LBS: does not recall        Last edited by Rockie Neighbours, [place] on 12/11/2019  1:07 PM. (History)      Referring physician: Helen Hashimoto., MD [place],  [place] [id number]  HISTORICAL INFORMATION:   Selected notes from the MEDICAL RECORD NUMBER       CURRENT MEDICATIONS: No current outpatient medications on file. (Ophthalmic Drugs)   No current facility-administered medications for this visit. (Ophthalmic Drugs)   Current Outpatient Medications (Other)  Medication Sig  . acetaminophen (TYLENOL) 500 MG tablet Take 1,000 mg by mouth daily as needed for mild pain.  Marland Kitchen albuterol (VENTOLIN HFA) 108 (90 Base) MCG/ACT inhaler SMARTSIG:2 Puff(s) By Mouth Every 4-6 Hours PRN  . ALPRAZolam (XANAX) 0.5 MG tablet Take 0.5 mg by mouth daily as needed for anxiety.  Marland Kitchen aspirin EC 81 MG tablet Take 81 mg by mouth daily.  . Budeson-Glycopyrrol-Formoterol (BREZTRI AEROSPHERE) 160-9-4.8 MCG/ACT AERO Inhale 2 puffs into the lungs 2 (two) times daily.  . Budeson-Glycopyrrol-Formoterol (BREZTRI AEROSPHERE) 160-9-4.8 MCG/ACT AERO Inhale 2 puffs into the lungs in the morning and at bedtime.  Marland Kitchen buPROPion (WELLBUTRIN XL) 300 MG 24 hr tablet Take 300 mg by mouth daily.  . cetirizine (ZYRTEC) 10 MG tablet Take 20 mg by mouth daily.  . cyanocobalamin 1000 MCG tablet Take 1,000 mcg by mouth daily.   Marland Kitchen EPINEPHrine (EPIPEN 2-PAK) 0.3 mg/0.3 mL IJ  SOAJ injection Inject 0.3 mLs (0.3 mg total) into the muscle as needed for anaphylaxis.  Marland Kitchen escitalopram (LEXAPRO) 10 MG tablet TAKE 1 TABLET BY MOUTH ONCE DAILY FOR ANXIETY  . finasteride (PROSCAR) 5 MG tablet Take 5 mg by mouth daily.  . fluocinonide (LIDEX) 0.05 % external solution APPLY 1 ML TOPICALLY TWICE DAILY  . hydrochlorothiazide (HYDRODIURIL) 25 MG tablet Take 25 mg by mouth daily.  Marland Kitchen losartan (COZAAR) 100 MG tablet Take 100 mg by mouth daily.  Marland Kitchen lovastatin (MEVACOR) 20 MG tablet   . metFORMIN (GLUCOPHAGE) 1000 MG tablet Take 500 mg by mouth daily with breakfast.   . montelukast (SINGULAIR) 10 MG tablet TAKE ONE TABLET ONCE DAILY  . NOVOLIN N RELION 100 UNIT/ML injection INJECT 40 TO 50 UNITS SUB Q TWICE DAILY. (TITRATE UP UNTIL FASTING AND BEDTIME GLUCOSE ARE LESS THAN 140)  . Omega-3 Fatty Acids (FISH OIL) 1200 MG CAPS Take 1,200 mg by mouth 2 (two) times daily.  . tamsulosin (FLOMAX) 0.4 MG CAPS capsule Take 0.4 mg by mouth daily.  Marland Kitchen triamcinolone cream (KENALOG) 0.1 % APPLY CREAM TO RASH TWICE DAILY   No current facility-administered medications for this visit. (Other)      REVIEW OF SYSTEMS:    ALLERGIES Allergies  Allergen Reactions  . Beef-Derived Products Anaphylaxis  . Lactose Intolerance (Gi)  Anaphylaxis and Diarrhea    Gas  . Pork-Derived Products Anaphylaxis    PAST MEDICAL HISTORY Past Medical History:  Diagnosis Date  . Anxiety   . BPH (benign prostatic hyperplasia)   . Cancer [facility])    prostate --  borderline...only takes meds   psa was elevated  . Depression   . Diabetes mellitus without complication ([place])    dx 1991  . Food allergy    Allergy to alpha-gal  . High cholesterol   . HOH (hard of hearing)   . Hypertension   . Pneumonia   . Sleep apnea    Past Surgical History:  Procedure Laterality Date  . EYE SURGERY     cataracts bilateral.   macular pucker  . LUMBAR LAMINECTOMY/DECOMPRESSION MICRODISCECTOMY N/A 03/04/2015   Procedure:  Lumbar two-Lumbar five decompressive lumbar laminectomies;  Surgeon: Jovita Gamma, MD;  Location: [place] [facility];  Service: Neurosurgery;  Laterality: N/A;  . TONSILLECTOMY    . VASECTOMY      FAMILY HISTORY Family History  Problem Relation Age of Onset  . Food Allergy Father     SOCIAL HISTORY Social History   Tobacco Use  . Smoking status: Former Smoker    Packs/day: 1.50    Years: 40.00    Pack years: 60.00    Types: Cigarettes    Quit date: 01/19/1989    Years since quitting: 30.9  . Smokeless tobacco: Never Used  Vaping Use  . Vaping Use: Never used  Substance Use Topics  . Alcohol use: Yes    Comment: Occassionally   . Drug use: No         OPHTHALMIC EXAM: Base Eye Exam    Visual Acuity (ETDRS)      Right Left   Dist cc 20/30 20/30 -1   Dist ph cc NI NI   Correction: Glasses       Tonometry (Tonopen, 1:05 PM)      Right Left   Pressure 15 13       Pupils      Pupils Dark Light Shape React APD   Right PERRL 3 2 Round Brisk None   Left PERRL 3 2 Round Brisk None       Visual Fields (Counting fingers)      Left Right    Full Full       Extraocular Movement      Right Left    Full Full       Neuro/Psych    Oriented x3: Yes   Mood/Affect: Normal       Dilation    Both eyes: 1.0% Mydriacyl, 2.5% Phenylephrine @ 1:09 PM        Slit Lamp and Fundus Exam    External Exam      Right Left   External Normal Normal       Slit Lamp Exam      Right Left   Lids/Lashes Normal Normal   Conjunctiva/Sclera White and quiet White and quiet   Cornea Clear Clear   Anterior Chamber Deep and quiet Deep and quiet   Iris Round and reactive Round and reactive   Lens Posterior chamber intraocular lens Posterior chamber intraocular lens   Anterior Vitreous Normal Normal       Fundus Exam      Right Left   Posterior Vitreous Normal Normal   Disc Normal Normal   C/D Ratio 0.6 0.5   Macula Microaneurysms, no clinically significant macular edema  Microaneurysms, no clinically  significant macular edema, Epiretinal membrane   Vessels NPDR-Severe PDR-quiet   Periphery Normal, extensive non perfusion Good PRP          IMAGING AND PROCEDURES  Imaging and Procedures for 12/11/19  OCT, Retina - OU - Both Eyes       Right Eye Quality was good. Scan locations included subfoveal. Central Foveal Thickness: 312. Progression has been stable. Findings include abnormal foveal contour.   Left Eye Quality was good. Scan locations included subfoveal. Central Foveal Thickness: 362. Progression has been stable. Findings include epiretinal membrane, abnormal foveal contour.   Notes No active diabetic maculopathy in either eye, Moderate epiretinal membrane is noted left eye with no foveal distortion and good acuity                ASSESSMENT/PLAN:  Left epiretinal membrane The nature of macular pucker (epiretinal membrane ERM) was discussed with the patient as well as threshold criteria for vitrectomy surgery. I explained that in rare cases another surgery is needed to actually remove a second wrinkle should it regrow.  Most often, the epiretinal membrane and underlying wrinkled internal limiting membrane are removed with the first surgery, to accomplish the goals.   If the operative eye is Phakic (natural lens still present), cataract surgery is often recommended prior to Vitrectomy. This will enable the retina surgeon to have the best view during surgery and the patient to obtain optimal results in the future. Treatment options were discussed.  Slight increase in retinal thickening, with good acuity will observe  Severe nonproliferative diabetic retinopathy of right eye ([place]) Extensive peripheral retinal nonperfusion.  Recent worsening of diabetic control due to series of lung disorders with use of prednisone for therapy  Stable treated proliferative diabetic retinopathy of left eye determined by examination associated with type 2  diabetes mellitus ([place]) The nature of regressed proliferative diabetic retinopathy was discussed with the patient. The patient was advised to maintain good glucose, blood pressure, monitor kidney function and serum lipid control as advised by personal physician. Rare risk for reactivation of progression exist with untreated severe anemia, untreated renal failure, untreated heart failure, and smoking. Complete avoidance of smoking was recommended. The chance of recurrent proliferative diabetic retinopathy was discussed as well as the chance of vitreous hemorrhage for which further treatments may be necessary.   Explained to the patient that the quiescent  proliferative diabetic retinopathy disease is unlikely to ever worsen.  Worsening factors would include however severe anemia, hypertension out-of-control or impending renal failure.      ICD-10-CM   1. Left epiretinal membrane  H35.372 OCT, Retina - OU - Both Eyes  2. Controlled type 2 diabetes mellitus with left eye affected by proliferative retinopathy with traction retinal detachment not involving macula, with long-term current use of insulin (HCC)  J82.5053 OCT, Retina - OU - Both Eyes   Z79.4   3. Severe nonproliferative diabetic retinopathy of right eye without macular edema associated with type 2 diabetes mellitus ([place])  E11.3491   4. Stable treated proliferative diabetic retinopathy of left eye determined by examination associated with type 2 diabetes mellitus ([place])  Z76.7341     1.  I discussed plan of delivering anterior peripheral PRP OD to prevent progression of retinal nonperfusion to proliferative diabetic retinopathy.  2.  Dilate OD next after color fundus photography, PRP anteriorly OD  3.  Ophthalmic Meds Ordered this visit:  No orders of the defined types were placed in this encounter.      Return  in about 2 weeks (around 12/25/2019) for dilate, COLOR FP, PRP, OD.  There are no Patient Instructions on file for this  visit.   Explained the diagnoses, plan, and follow up with the patient and they expressed understanding.  Patient expressed understanding of the importance of proper follow up care.   Nathaniel Gray M.D. Diseases & Surgery of the Retina and Vitreous Retina & Diabetic [place] 12/11/19     Abbreviations: M myopia (nearsighted); A astigmatism; H hyperopia (farsighted); P presbyopia; Mrx spectacle prescription;  CTL contact lenses; OD right eye; OS left eye; OU both eyes  XT exotropia; ET esotropia; PEK punctate epithelial keratitis; PEE punctate epithelial erosions; DES dry eye syndrome; MGD meibomian gland dysfunction; ATs artificial tears; PFAT's preservative free artificial tears; [place] nuclear sclerotic cataract; PSC posterior subcapsular cataract; ERM epi-retinal membrane; PVD posterior vitreous detachment; RD retinal detachment; DM diabetes mellitus; DR diabetic retinopathy; NPDR non-proliferative diabetic retinopathy; PDR proliferative diabetic retinopathy; CSME clinically significant macular edema; DME diabetic macular edema; dbh dot blot hemorrhages; CWS cotton wool spot; POAG primary open angle glaucoma; C/D cup-to-disc ratio; HVF humphrey visual field; GVF goldmann visual field; OCT optical coherence tomography; IOP intraocular pressure; BRVO Branch retinal vein occlusion; CRVO central retinal vein occlusion; CRAO central retinal artery occlusion; BRAO branch retinal artery occlusion; RT retinal tear; SB scleral buckle; PPV pars plana vitrectomy; VH Vitreous hemorrhage; PRP panretinal laser photocoagulation; IVK intravitreal kenalog; VMT vitreomacular traction; MH Macular hole;  NVD neovascularization of the disc; NVE neovascularization elsewhere; AREDS age related eye disease study; ARMD age related macular degeneration; POAG primary open angle glaucoma; EBMD epithelial/anterior basement membrane dystrophy; ACIOL anterior chamber intraocular lens; IOL intraocular lens; PCIOL posterior chamber  intraocular lens; Phaco/IOL phacoemulsification with intraocular lens placement; [place] photorefractive keratectomy; LASIK laser assisted in situ keratomileusis; HTN hypertension; DM diabetes mellitus; COPD chronic obstructive pulmonary disease

## 2019-12-11 NOTE — Assessment & Plan Note (Signed)
Extensive peripheral retinal nonperfusion.  Recent worsening of diabetic control due to series of lung disorders with use of prednisone for therapy

## 2019-12-11 NOTE — Assessment & Plan Note (Signed)

## 2019-12-26 ENCOUNTER — Encounter (INDEPENDENT_AMBULATORY_CARE_PROVIDER_SITE_OTHER): Payer: Medicare Other | Admitting: Ophthalmology

## 2020-01-09 ENCOUNTER — Ambulatory Visit (INDEPENDENT_AMBULATORY_CARE_PROVIDER_SITE_OTHER): Payer: Medicare Other | Admitting: Ophthalmology

## 2020-01-09 ENCOUNTER — Encounter (INDEPENDENT_AMBULATORY_CARE_PROVIDER_SITE_OTHER): Payer: Self-pay | Admitting: Ophthalmology

## 2020-01-09 ENCOUNTER — Other Ambulatory Visit: Payer: Self-pay

## 2020-01-09 DIAGNOSIS — E113491 Type 2 diabetes mellitus with severe nonproliferative diabetic retinopathy without macular edema, right eye: Secondary | ICD-10-CM | POA: Diagnosis not present

## 2020-01-09 DIAGNOSIS — Z794 Long term (current) use of insulin: Secondary | ICD-10-CM

## 2020-01-09 DIAGNOSIS — E113552 Type 2 diabetes mellitus with stable proliferative diabetic retinopathy, left eye: Secondary | ICD-10-CM

## 2020-01-09 NOTE — Progress Notes (Signed)
01/09/2020     CHIEF COMPLAINT Patient presents for Retina Follow Up (1 Month PRP OD//Pt denies noticeable changes to [place] OU since last visit. Pt denies ocular pain, flashes of light, or floaters OU. //)   HISTORY OF PRESENT ILLNESS: Nathaniel Gray. is a 84 y.o. male who presents to the clinic today for:   HPI    Retina Follow Up    Patient presents with  Other.  In left eye.  This started 1 month ago.  Severity is mild.  Duration of 1 month.  Since onset it is stable. Additional comments: 1 Month PRP OD  Pt denies noticeable changes to [place] OU since last visit. Pt denies ocular pain, flashes of light, or floaters OU.          Last edited by Rockie Neighbours, [place] on 01/09/2020  1:35 PM. (History)      Referring physician: Cyndi Bender, PA-C [place],  [place] [address]  HISTORICAL INFORMATION:   Selected notes from the [facility]: No current outpatient medications on file. (Ophthalmic Drugs)   No current facility-administered medications for this visit. (Ophthalmic Drugs)   Current Outpatient Medications (Other)  Medication Sig  . acetaminophen (TYLENOL) 500 MG tablet Take 1,000 mg by mouth daily as needed for mild pain.  Marland Kitchen albuterol (VENTOLIN HFA) 108 (90 Base) MCG/ACT inhaler SMARTSIG:2 Puff(s) By Mouth Every 4-6 Hours PRN  . ALPRAZolam (XANAX) 0.5 MG tablet Take 0.5 mg by mouth daily as needed for anxiety.  Marland Kitchen aspirin EC 81 MG tablet Take 81 mg by mouth daily.  . Budeson-Glycopyrrol-Formoterol (BREZTRI AEROSPHERE) 160-9-4.8 MCG/ACT AERO Inhale 2 puffs into the lungs 2 (two) times daily.  . Budeson-Glycopyrrol-Formoterol (BREZTRI AEROSPHERE) 160-9-4.8 MCG/ACT AERO Inhale 2 puffs into the lungs in the morning and at bedtime.  Marland Kitchen buPROPion (WELLBUTRIN XL) 300 MG 24 hr tablet Take 300 mg by mouth daily.  . cetirizine (ZYRTEC) 10 MG tablet Take 20 mg by mouth daily.  . cyanocobalamin 1000 MCG tablet Take 1,000 mcg by mouth  daily.   Marland Kitchen EPINEPHrine (EPIPEN 2-PAK) 0.3 mg/0.3 mL IJ SOAJ injection Inject 0.3 mLs (0.3 mg total) into the muscle as needed for anaphylaxis.  Marland Kitchen escitalopram (LEXAPRO) 10 MG tablet TAKE 1 TABLET BY MOUTH ONCE DAILY FOR ANXIETY  . finasteride (PROSCAR) 5 MG tablet Take 5 mg by mouth daily.  . fluocinonide (LIDEX) 0.05 % external solution APPLY 1 ML TOPICALLY TWICE DAILY  . hydrochlorothiazide (HYDRODIURIL) 25 MG tablet Take 25 mg by mouth daily.  Marland Kitchen losartan (COZAAR) 100 MG tablet Take 100 mg by mouth daily.  Marland Kitchen lovastatin (MEVACOR) 20 MG tablet   . metFORMIN (GLUCOPHAGE) 1000 MG tablet Take 500 mg by mouth daily with breakfast.   . montelukast (SINGULAIR) 10 MG tablet TAKE ONE TABLET ONCE DAILY  . NOVOLIN N RELION 100 UNIT/ML injection INJECT 40 TO 50 UNITS SUB Q TWICE DAILY. (TITRATE UP UNTIL FASTING AND BEDTIME GLUCOSE ARE LESS THAN 140)  . Omega-3 Fatty Acids (FISH OIL) 1200 MG CAPS Take 1,200 mg by mouth 2 (two) times daily.  . tamsulosin (FLOMAX) 0.4 MG CAPS capsule Take 0.4 mg by mouth daily.  Marland Kitchen triamcinolone cream (KENALOG) 0.1 % APPLY CREAM TO RASH TWICE DAILY   No current facility-administered medications for this visit. (Other)      REVIEW OF SYSTEMS:    ALLERGIES Allergies  Allergen Reactions  . Beef-Derived Products Anaphylaxis  .  Lactose Intolerance (Gi) Anaphylaxis and Diarrhea    Gas  . Pork-Derived Products Anaphylaxis    PAST MEDICAL HISTORY Past Medical History:  Diagnosis Date  . Anxiety   . BPH (benign prostatic hyperplasia)   . Cancer [facility])    prostate --  borderline...only takes meds   psa was elevated  . Depression   . Diabetes mellitus without complication ([place])    dx 1991  . Food allergy    Allergy to alpha-gal  . High cholesterol   . HOH (hard of hearing)   . Hypertension   . Pneumonia   . Sleep apnea    Past Surgical History:  Procedure Laterality Date  . EYE SURGERY     cataracts bilateral.   macular pucker  . LUMBAR  LAMINECTOMY/DECOMPRESSION MICRODISCECTOMY N/A 03/04/2015   Procedure: Lumbar two-Lumbar five decompressive lumbar laminectomies;  Surgeon: Jovita Gamma, MD;  Location: [facility]UR[facility] ORS;  Service: Neurosurgery;  Laterality: N/A;  . TONSILLECTOMY    . VASECTOMY      FAMILY HISTORY Family History  Problem Relation Age of Onset  . Food Allergy Father     SOCIAL HISTORY Social History   Tobacco Use  . Smoking status: Former Smoker    Packs/day: 1.50    Years: 40.00    Pack years: 60.00    Types: Cigarettes    Quit date: 01/19/1989    Years since quitting: 30.9  . Smokeless tobacco: Never Used  Vaping Use  . Vaping Use: Never used  Substance Use Topics  . Alcohol use: Yes    Comment: Occassionally   . Drug use: No         OPHTHALMIC EXAM:  Base Eye Exam    Visual Acuity (ETDRS)      Right Left   Dist cc 20/30 -1 20/40 +1   Dist ph cc NI NI   Correction: Glasses       Tonometry (Tonopen, 1:35 PM)      Right Left   Pressure 20 17       Pupils      Pupils Dark Light Shape React APD   Right PERRL 3 2 Round Brisk None   Left PERRL 3 2 Round Brisk None       Visual Fields (Counting fingers)      Left Right    Full Full       Extraocular Movement      Right Left    Full Full       Neuro/Psych    Oriented x3: Yes   Mood/Affect: Normal       Dilation    Right eye: 1.0% Mydriacyl, 2.5% Phenylephrine @ 1:39 PM          IMAGING AND PROCEDURES  Imaging and Procedures for 01/09/20  Color Fundus Photography Optos - OU - Both Eyes       Right Eye Progression has been stable. Disc findings include normal observations. Macula : normal observations.   Left Eye Progression has been stable. Vessels : normal observations.   Notes Severe nonproliferative diabetic retinopathy with extensive dot blot hemorrhages mid periphery and anterior OD signifying retinal nonperfusion OD  OS, PDR quiet good PRP       Panretinal Photocoagulation - OD - Right Eye        Time Out Confirmed correct patient, procedure, site, and patient consented.   Anesthesia Topical anesthesia was used. Anesthetic medications included Proparacaine 0.5%.   Laser Information The type of laser was diode.  Color was yellow. The duration in seconds was 0.03. The spot size was 390 microns. Laser power was 260. Total spots was 792.   Post-op The patient tolerated the procedure well. There were no complications. The patient received written and verbal post procedure care education.                 ASSESSMENT/PLAN:  Severe nonproliferative diabetic retinopathy of right eye ([place]) Plan is to treat with anterior PRP today to region of retinal nonperfusion so as to prevent rapid progression of PDR ever in the future should the patient become systemically ill and lack of follow-up  Stable treated proliferative diabetic retinopathy of left eye determined by examination associated with type 2 diabetes mellitus ([place]) Stable OS      ICD-10-CM   1. Severe nonproliferative diabetic retinopathy of right eye without macular edema associated with type 2 diabetes mellitus (HCC)  [id number] Color Fundus Photography Optos - OU - Both Eyes    Panretinal Photocoagulation - OD - Right Eye  2. Controlled type 2 diabetes mellitus with left eye affected by proliferative retinopathy with traction retinal detachment not involving macula, with long-term current use of insulin (HCC)  [id number] Color Fundus Photography Optos - OU - Both Eyes   Z79.4   3. Stable treated proliferative diabetic retinopathy of left eye determined by examination associated with type 2 diabetes mellitus ([place])  [id number]     1.  Severe NPDR, PRP #1 OD  2.  3.  Ophthalmic Meds Ordered this visit:  No orders of the defined types were placed in this encounter.      Return in about 4 months (around 05/09/2020) for COLOR FP, DILATE OU.  There are no Patient Instructions on file for this visit.   Explained the  diagnoses, plan, and follow up with the patient and they expressed understanding.  Patient expressed understanding of the importance of proper follow up care.   Clent Demark Marguerite Jarboe M.D. Diseases & Surgery of the Retina and Vitreous Retina & Diabetic [place] 01/09/20     Abbreviations: M myopia (nearsighted); A astigmatism; H hyperopia (farsighted); P presbyopia; Mrx spectacle prescription;  CTL contact lenses; OD right eye; OS left eye; OU both eyes  XT exotropia; ET esotropia; PEK punctate epithelial keratitis; PEE punctate epithelial erosions; DES dry eye syndrome; MGD meibomian gland dysfunction; ATs artificial tears; PFAT's preservative free artificial tears; [place] nuclear sclerotic cataract; PSC posterior subcapsular cataract; ERM epi-retinal membrane; PVD posterior vitreous detachment; RD retinal detachment; DM diabetes mellitus; DR diabetic retinopathy; NPDR non-proliferative diabetic retinopathy; PDR proliferative diabetic retinopathy; CSME clinically significant macular edema; DME diabetic macular edema; dbh dot blot hemorrhages; CWS cotton wool spot; POAG primary open angle glaucoma; C/D cup-to-disc ratio; HVF humphrey visual field; GVF goldmann visual field; OCT optical coherence tomography; IOP intraocular pressure; BRVO Branch retinal vein occlusion; CRVO central retinal vein occlusion; CRAO central retinal artery occlusion; BRAO branch retinal artery occlusion; RT retinal tear; SB scleral buckle; PPV pars plana vitrectomy; VH Vitreous hemorrhage; PRP panretinal laser photocoagulation; IVK intravitreal kenalog; VMT vitreomacular traction; MH Macular hole;  NVD neovascularization of the disc; NVE neovascularization elsewhere; AREDS age related eye disease study; ARMD age related macular degeneration; POAG primary open angle glaucoma; EBMD epithelial/anterior basement membrane dystrophy; ACIOL anterior chamber intraocular lens; IOL intraocular lens; PCIOL posterior chamber intraocular lens;  Phaco/IOL phacoemulsification with intraocular lens placement; [place] photorefractive keratectomy; LASIK laser assisted in situ keratomileusis; HTN hypertension; DM diabetes mellitus; COPD chronic obstructive pulmonary disease

## 2020-01-09 NOTE — Assessment & Plan Note (Signed)
Plan is to treat with anterior PRP today to region of retinal nonperfusion so as to prevent rapid progression of PDR ever in the future should the patient become systemically ill and lack of follow-up

## 2020-01-09 NOTE — Assessment & Plan Note (Signed)
Stable OS 

## 2020-03-04 ENCOUNTER — Telehealth: Payer: Self-pay | Admitting: Pulmonary Disease

## 2020-03-04 NOTE — Telephone Encounter (Signed)
Found forms in MH's signed forms from Friday. Will have MH to sign the forms tomorrow afternoon during clinic.

## 2020-03-04 NOTE — Telephone Encounter (Signed)
Filled out new re-enrollment forms and placed in MD box for signature, in case unable to locate old forms

## 2020-03-04 NOTE — Telephone Encounter (Signed)
Pharmacy team has not received any [place] re-enrollment forms for the patient. Called [facility] [place], forms have not been faxed to them.   Have these forms been placed in Provider's box for signature?

## 2020-03-05 NOTE — Telephone Encounter (Signed)
Returned Cathy's call and advised of above. Patient may need to pick up a [place] sample if not approved by the end of the month. They will contact office.  Will route to [place] as an [employer]

## 2020-03-07 NOTE — Telephone Encounter (Signed)
Submitted Patient Assistance Application to [facility] [place] for [place] along with provider portion, PA and income documents. Will update patient when we receive a response.  Fax# [phone] Phone# [phone]  

## 2020-03-07 NOTE — Telephone Encounter (Signed)
Signed forms have been placed in the pharmacy inbox up front.

## 2020-03-08 NOTE — Telephone Encounter (Signed)
[facility] [place] called and the 2nd page of the Patirnt Assistance w/ Dr. Serena Croissant was not received. Please fax again. [phone] fax #

## 2020-03-08 NOTE — Telephone Encounter (Signed)
Refaxed Provider page.

## 2020-03-12 NOTE — Telephone Encounter (Signed)
Refaxed whole application.

## 2020-03-12 NOTE — Telephone Encounter (Signed)
Manus Gunning from [place] calling because they are missing the page with the provider's name and office name and the patient diagnosis. Manus Gunning can be reached back at [phone].

## 2020-03-14 NOTE — Telephone Encounter (Signed)
Returned call, rep states they still do not see where they received refaxed documentation. Advised that offiec received a fax confirmation.  Rep provided alternate fax number- [phone].  Refaxed to original fax number and alternate fax number. Received fax confirmation. Will follow up to confirm receipt tomorrow.  Phone# [phone]

## 2020-03-14 NOTE — Telephone Encounter (Signed)
Nathaniel Gray from [place] calling because the page with the doctor name and the patient's diagnosis is still not filled out properly. They received the application but that part was still missing. Nathaniel Gray can be reached at [phone].

## 2020-03-15 NOTE — Telephone Encounter (Signed)
Called [facility] [place] to follow up on paperwork. All documents have been received. Application is in the review status. Awaiting determination.

## 2020-03-18 ENCOUNTER — Telehealth: Payer: Self-pay | Admitting: Pulmonary Disease

## 2020-03-18 NOTE — Telephone Encounter (Signed)
Received a fax from  [place] [facility] [place] regarding an approval for [place] patient assistance from 03/15/20 to 01/18/21.   Self injection medication will come from [employer] order  Phone number: [phone]

## 2020-03-18 NOTE — Telephone Encounter (Signed)
disregard

## 2020-05-07 ENCOUNTER — Encounter (INDEPENDENT_AMBULATORY_CARE_PROVIDER_SITE_OTHER): Payer: Self-pay | Admitting: Ophthalmology

## 2020-05-07 ENCOUNTER — Ambulatory Visit (INDEPENDENT_AMBULATORY_CARE_PROVIDER_SITE_OTHER): Payer: Medicare Other | Admitting: Ophthalmology

## 2020-05-07 ENCOUNTER — Other Ambulatory Visit: Payer: Self-pay

## 2020-05-07 DIAGNOSIS — Z9889 Other specified postprocedural states: Secondary | ICD-10-CM | POA: Diagnosis not present

## 2020-05-07 DIAGNOSIS — E113491 Type 2 diabetes mellitus with severe nonproliferative diabetic retinopathy without macular edema, right eye: Secondary | ICD-10-CM | POA: Diagnosis not present

## 2020-05-07 DIAGNOSIS — E113552 Type 2 diabetes mellitus with stable proliferative diabetic retinopathy, left eye: Secondary | ICD-10-CM | POA: Diagnosis not present

## 2020-05-07 NOTE — Progress Notes (Signed)
05/07/2020     CHIEF COMPLAINT Patient presents for Retina Follow Up (4 Mo F/U OU//Pt denies noticeable changes to [place] OU since last visit. Pt denies ocular pain, flashes of light, or floaters OU. //A1c: 7.8, 3 mo ago/LBS: did not check)   HISTORY OF PRESENT ILLNESS: Nathaniel Gray. is a 85 y.o. male who presents to the clinic today for:   HPI    Retina Follow Up    Patient presents with  Diabetic Retinopathy.  In right eye.  This started 4 months ago.  Severity is moderate.  Duration of 4 months.  Since onset it is stable. Additional comments: 4 Mo F/U OU  Pt denies noticeable changes to [place] OU since last visit. Pt denies ocular pain, flashes of light, or floaters OU.   A1c: 7.8, 3 mo ago LBS: did not check       Last edited by Rockie Neighbours, [place] on 05/07/2020  1:50 PM. (History)      Referring physician: Cyndi Bender, PA-C [place],  [place] [address]  HISTORICAL INFORMATION:   Selected notes from the [facility]: No current outpatient medications on file. (Ophthalmic Drugs)   No current facility-administered medications for this visit. (Ophthalmic Drugs)   Current Outpatient Medications (Other)  Medication Sig  . acetaminophen (TYLENOL) 500 MG tablet Take 1,000 mg by mouth daily as needed for mild pain.  Marland Kitchen albuterol (VENTOLIN HFA) 108 (90 Base) MCG/ACT inhaler SMARTSIG:2 Puff(s) By Mouth Every 4-6 Hours PRN  . ALPRAZolam (XANAX) 0.5 MG tablet Take 0.5 mg by mouth daily as needed for anxiety.  Marland Kitchen aspirin EC 81 MG tablet Take 81 mg by mouth daily.  . Budeson-Glycopyrrol-Formoterol (BREZTRI AEROSPHERE) 160-9-4.8 MCG/ACT AERO Inhale 2 puffs into the lungs 2 (two) times daily.  . Budeson-Glycopyrrol-Formoterol (BREZTRI AEROSPHERE) 160-9-4.8 MCG/ACT AERO Inhale 2 puffs into the lungs in the morning and at bedtime.  Marland Kitchen buPROPion (WELLBUTRIN XL) 300 MG 24 hr tablet Take 300 mg by mouth daily.  . cetirizine (ZYRTEC) 10 MG  tablet Take 20 mg by mouth daily.  . cyanocobalamin 1000 MCG tablet Take 1,000 mcg by mouth daily.   Marland Kitchen EPINEPHrine (EPIPEN 2-PAK) 0.3 mg/0.3 mL IJ SOAJ injection Inject 0.3 mLs (0.3 mg total) into the muscle as needed for anaphylaxis.  Marland Kitchen escitalopram (LEXAPRO) 10 MG tablet TAKE 1 TABLET BY MOUTH ONCE DAILY FOR ANXIETY  . finasteride (PROSCAR) 5 MG tablet Take 5 mg by mouth daily.  . fluocinonide (LIDEX) 0.05 % external solution APPLY 1 ML TOPICALLY TWICE DAILY  . hydrochlorothiazide (HYDRODIURIL) 25 MG tablet Take 25 mg by mouth daily.  Marland Kitchen losartan (COZAAR) 100 MG tablet Take 100 mg by mouth daily.  Marland Kitchen lovastatin (MEVACOR) 20 MG tablet   . metFORMIN (GLUCOPHAGE) 1000 MG tablet Take 500 mg by mouth daily with breakfast.   . montelukast (SINGULAIR) 10 MG tablet TAKE ONE TABLET ONCE DAILY  . NOVOLIN N RELION 100 UNIT/ML injection INJECT 40 TO 50 UNITS SUB Q TWICE DAILY. (TITRATE UP UNTIL FASTING AND BEDTIME GLUCOSE ARE LESS THAN 140)  . Omega-3 Fatty Acids (FISH OIL) 1200 MG CAPS Take 1,200 mg by mouth 2 (two) times daily.  . tamsulosin (FLOMAX) 0.4 MG CAPS capsule Take 0.4 mg by mouth daily.  Marland Kitchen triamcinolone cream (KENALOG) 0.1 % APPLY CREAM TO RASH TWICE DAILY   No current facility-administered medications for this visit. (Other)      REVIEW OF  SYSTEMS:    ALLERGIES Allergies  Allergen Reactions  . Beef-Derived Products Anaphylaxis  . Lactose Intolerance (Gi) Anaphylaxis and Diarrhea    Gas  . Pork-Derived Products Anaphylaxis    PAST MEDICAL HISTORY Past Medical History:  Diagnosis Date  . Anxiety   . BPH (benign prostatic hyperplasia)   . Cancer [facility])    prostate --  borderline...only takes meds   psa was elevated  . Depression   . Diabetes mellitus without complication ([place])    dx 1991  . Food allergy    Allergy to alpha-gal  . High cholesterol   . HOH (hard of hearing)   . Hypertension   . Pneumonia   . Sleep apnea    Past Surgical History:  Procedure Laterality  Date  . EYE SURGERY     cataracts bilateral.   macular pucker  . LUMBAR LAMINECTOMY/DECOMPRESSION MICRODISCECTOMY N/A 03/04/2015   Procedure: Lumbar two-Lumbar five decompressive lumbar laminectomies;  Surgeon: Jovita Gamma, MD;  Location: [facility] NEURO ORS;  Service: Neurosurgery;  Laterality: N/A;  . TONSILLECTOMY    . VASECTOMY      FAMILY HISTORY Family History  Problem Relation Age of Onset  . Food Allergy Father     SOCIAL HISTORY Social History   Tobacco Use  . Smoking status: Former Smoker    Packs/day: 1.50    Years: 40.00    Pack years: 60.00    Types: Cigarettes    Quit date: 01/19/1989    Years since quitting: 31.3  . Smokeless tobacco: Never Used  Vaping Use  . Vaping Use: Never used  Substance Use Topics  . Alcohol use: Yes    Comment: Occassionally   . Drug use: No         OPHTHALMIC EXAM:  Base Eye Exam    Visual Acuity (ETDRS)      Right Left   Dist cc 20/30 -2 20/30 +1   Dist ph cc NI NI   Correction: Glasses       Tonometry (Tonopen, 1:53 PM)      Right Left   Pressure 14 16       Pupils      Pupils Dark Light Shape React APD   Right PERRL 5 4 Round Brisk None   Left PERRL 5 4 Round Brisk None       Visual Fields (Counting fingers)      Left Right    Full Full       Extraocular Movement      Right Left    Full Full       Neuro/Psych    Oriented x3: Yes   Mood/Affect: Normal       Dilation    Both eyes: 1.0% Mydriacyl, 2.5% Phenylephrine @ 1:53 PM        Slit Lamp and Fundus Exam    External Exam      Right Left   External Normal Normal       Slit Lamp Exam      Right Left   Lids/Lashes Normal Normal   Conjunctiva/Sclera White and quiet White and quiet   Cornea Clear Clear   Anterior Chamber Deep and quiet Deep and quiet   [place] Round and reactive Round and reactive   Lens Posterior chamber intraocular lens Posterior chamber intraocular lens   Anterior Vitreous Normal Normal       Fundus Exam      Right Left    Posterior Vitreous Normal, clear avitric Normal,  clear avitric   Disc Normal Normal   C/D Ratio 0.6 0.6   Macula Microaneurysms, no clinically significant macular edema Microaneurysms, no clinically significant macular edema, Epiretinal membrane   Vessels NPDR-Severe PDR-quiet   Periphery Normal, extensive non perfusion, moderate scatter PRP temporally, and nasally, no active disease progression Good PRP          IMAGING AND PROCEDURES  Imaging and Procedures for 05/07/20  Color Fundus Photography Optos - OU - Both Eyes       Right Eye Progression has been stable. Disc findings include normal observations. Macula : normal observations.   Left Eye Progression has been stable.   Notes Severe nonproliferative diabetic retinopathy with less extensive peripheral hemorrhages now status post moderate scatter PRP, temporal quadrant, nasal quadrant OS, PDR quiet good PRP                ASSESSMENT/PLAN:  History of vitrectomy Resolved, 2017  Severe nonproliferative diabetic retinopathy of right eye ([place]) Stable with no progression, status post moderate scatter PRP temporally in regions of retinal nonperfusion as well as nasally.  No active disease continue to observe  Stable treated proliferative diabetic retinopathy of left eye determined by examination associated with type 2 diabetes mellitus ([place]) Regressed PDR, stable with good acuity      ICD-10-CM   1. Severe nonproliferative diabetic retinopathy of right eye without macular edema associated with type 2 diabetes mellitus (HCC)  J88.4166 Color Fundus Photography Optos - OU - Both Eyes  2. History of vitrectomy  Z98.890   3. Stable treated proliferative diabetic retinopathy of left eye determined by examination associated with type 2 diabetes mellitus ([place])  A63.0160     1.  OU, no progression of diabetic retinopathy.  We will continue to observe  2.  3.  Ophthalmic Meds Ordered this visit:  No orders of the  defined types were placed in this encounter.      Return in about 6 months (around 11/06/2020) for COLOR FP, OCT, DILATE OU.  There are no Patient Instructions on file for this visit.   Explained the diagnoses, plan, and follow up with the patient and they expressed understanding.  Patient expressed understanding of the importance of proper follow up care.   Clent Demark Casidee Jann M.D. Diseases & Surgery of the Retina and Vitreous Retina & Diabetic [place] 05/07/20     Abbreviations: M myopia (nearsighted); A astigmatism; H hyperopia (farsighted); P presbyopia; Mrx spectacle prescription;  CTL contact lenses; OD right eye; OS left eye; OU both eyes  XT exotropia; ET esotropia; PEK punctate epithelial keratitis; PEE punctate epithelial erosions; DES dry eye syndrome; MGD meibomian gland dysfunction; ATs artificial tears; PFAT's preservative free artificial tears; [place] nuclear sclerotic cataract; PSC posterior subcapsular cataract; ERM epi-retinal membrane; PVD posterior vitreous detachment; RD retinal detachment; DM diabetes mellitus; DR diabetic retinopathy; NPDR non-proliferative diabetic retinopathy; PDR proliferative diabetic retinopathy; CSME clinically significant macular edema; DME diabetic macular edema; dbh dot blot hemorrhages; CWS cotton wool spot; POAG primary open angle glaucoma; C/D cup-to-disc ratio; HVF humphrey visual field; GVF goldmann visual field; OCT optical coherence tomography; IOP intraocular pressure; BRVO Branch retinal vein occlusion; CRVO central retinal vein occlusion; CRAO central retinal artery occlusion; BRAO branch retinal artery occlusion; RT retinal tear; SB scleral buckle; PPV pars plana vitrectomy; VH Vitreous hemorrhage; PRP panretinal laser photocoagulation; IVK intravitreal kenalog; VMT vitreomacular traction; MH Macular hole;  NVD neovascularization of the disc; NVE neovascularization elsewhere; AREDS age related eye disease study; ARMD age  related macular  degeneration; POAG primary open angle glaucoma; EBMD epithelial/anterior basement membrane dystrophy; ACIOL anterior chamber intraocular lens; IOL intraocular lens; PCIOL posterior chamber intraocular lens; Phaco/IOL phacoemulsification with intraocular lens placement; [place] photorefractive keratectomy; LASIK laser assisted in situ keratomileusis; HTN hypertension; DM diabetes mellitus; COPD chronic obstructive pulmonary disease

## 2020-05-07 NOTE — Assessment & Plan Note (Signed)
Regressed PDR, stable with good acuity

## 2020-05-07 NOTE — Assessment & Plan Note (Signed)
Resolved, 2017

## 2020-05-07 NOTE — Assessment & Plan Note (Signed)
Stable with no progression, status post moderate scatter PRP temporally in regions of retinal nonperfusion as well as nasally.  No active disease continue to observe

## 2020-08-13 ENCOUNTER — Encounter: Payer: Self-pay | Admitting: Pulmonary Disease

## 2020-08-13 ENCOUNTER — Other Ambulatory Visit: Payer: Self-pay

## 2020-08-13 ENCOUNTER — Ambulatory Visit: Payer: Medicare Other | Admitting: Pulmonary Disease

## 2020-08-13 VITALS — BP 138/58 | HR 53 | Ht 69.0 in | Wt 300.6 lb

## 2020-08-13 DIAGNOSIS — J4520 Mild intermittent asthma, uncomplicated: Secondary | ICD-10-CM

## 2020-08-13 DIAGNOSIS — G4733 Obstructive sleep apnea (adult) (pediatric): Secondary | ICD-10-CM

## 2020-08-13 DIAGNOSIS — J449 Chronic obstructive pulmonary disease, unspecified: Secondary | ICD-10-CM

## 2020-08-13 MED ORDER — BREZTRI AEROSPHERE 160-9-4.8 MCG/ACT IN AERO: 2.0000 | INHALATION_SPRAY | Freq: Two times a day (BID) | RESPIRATORY_TRACT | 0 refills | Status: DC

## 2020-08-13 NOTE — Patient Instructions (Signed)
Nice to see you again  No medication changes.  I will have you fill out paperwork for manufacturing assistance with the cost of Breztri.  Please call [place] [facility] and ask if there is a cheaper substitute, Trelegy would be the only medication we could substitute but I hate to change since you are doing so well from a breathing standpoint.  If your dermatologist wants to coordinate care or has questions about being Nucala, please let he or she know I am happy to discuss at any time.  Return to clinic in 6 months or sooner as needed with Dr. Silas Flood

## 2020-08-20 NOTE — Progress Notes (Signed)
Patient ID: Nathaniel Gray., male    DOB: 1935-02-01, 85 y.o.   MRN: [id number]  Chief Complaint  Patient presents with   Follow-up    CPAP working good at night. Breathing sometime feel like he's out of breath, normal when this happen's he notice he's breathing through his mouth.     Referring provider: Cyndi Bender, PA-C  HPI:  Mr. Herskovitz is a 85 year old man with OSA, insulin-dependent diabetes, history of tobacco abuse (60-80-pack-year history) who resolve with recurrent wheeze and dyspnea on exertion.  Recurrent exacerbations requiring prednisone.  Further work-up demonstrated mixed restrictive (obesity)/obstructive physiology (COPD/asthma) with pseudonormalization of the FEV1/FVC ratio with moderate restriction on lung volumes with severely reduced ERV as well as evidence of gas trapping with elevated RV/TLC ratio.  Labs notable for elevated eosinophil count of 700.   08/21/2020  - Visit  Today he is feeling well.  Breathing stable. Remains on Nucala to great effect, no exacerbation since starting.  He reports good adherence to his main inhaler regimen.  Rare if any use of albuterol.  No exacerbations.   Questionaires / Pulmonary Flowsheets:   ACT:  No flowsheet data found.  MMRC: No flowsheet data found.  Epworth:  No flowsheet data found.  Tests:   FENO:  No results found for: NITRICOXIDE  PFT: Interpreted as mixed obstructive with pseudonormalization of FEV1 to FVC ratio as well as restrictive physiology given moderately decreased TLC, RV to TLC ratio of 133% predicted suggestive of gas trapping.  DLCO was moderately reduced. PFT Results Latest Ref Rng & Units 09/05/2019  FVC-Pre L 2.46  FVC-Predicted Pre % 65  FVC-Post L 2.25  FVC-Predicted Post % 59  Pre FEV1/FVC % % 77  Post FEV1/FCV % % 80  FEV1-Pre L 1.90  FEV1-Predicted Pre % 71  FEV1-Post L 1.81  DLCO uncorrected ml/min/mmHg 15.32  DLCO UNC% % 65  DLCO corrected ml/min/mmHg 17.02  DLCO COR %Predicted  % 72  DLVA Predicted % 101  TLC L 5.02  TLC % Predicted % 73  RV % Predicted % 100    WALK:  Interpretation as follows: Patient ambulated 272 m.  He had to stop in the middle of the test due to back pain shortness of breath and eventually end of the walk early due to low back pain.  At the start of test, heart rate 69, BP 120/70, O2 sat 98 percent.  Oxygen nadir to 90% at end of test.  At end of test blood pressure 140/90, heart rate 94.  Upon resting, heart rate 82, blood pressure 130/84, oxygen saturation 97%.  Test demonstrates normal increase in heart rate and blood pressure but is abnormal with decrease in oxygen saturation to 90%.  No supplemental oxygen required during test. SIX MIN WALK 09/13/2019  Medications Breztri inhaler 160-9-4.29mg and Metformin '1000mg'$  at 8:30  Supplimental Oxygen during Test? (L/min) No  Laps 8  Partial Lap (in Meters) 0  Baseline BP (sitting) 120/70  Baseline Heartrate 69  Baseline Dyspnea (Borg Scale) 0  Baseline Fatigue (Borg Scale) 0  Baseline SPO2 98  BP (sitting) 140/90  Heartrate 94  Dyspnea (Borg Scale) 4  Fatigue (Borg Scale) 1  SPO2 90  BP (sitting) 130/84  Heartrate 82  SPO2 97  Stopped or Paused before Six Minutes Yes  Other Symptoms at end of Exercise Patient had to pause with 385m 27sec remaining for 29m41mtes due to back pain. Patient then had to fully stop walk with 1 min 40  sec remaining due to back pain and SOB  Distance Completed 272  Distance Completed 0  Tech Comments: Pt walked at a moderate pace having complaints of back pain. Pt was not able to complete entire 6 minutes due to back pain and complaints of SOB.    Imaging: No results found.  Lab Results: Personally reviewed CBC    Component Value Date/Time   WBC 8.8 08/15/2019 1458   RBC 3.96 (L) 08/15/2019 1458   HGB 11.5 (L) 08/15/2019 1458   HCT 34.2 (L) 08/15/2019 1458   PLT 146.0 (L) 08/15/2019 1458   MCV 86.3 08/15/2019 1458   MCH 29.0 02/25/2015 1140   MCHC  33.5 08/15/2019 1458   RDW 14.5 08/15/2019 1458   LYMPHSABS 2.4 08/15/2019 1458   MONOABS 0.8 08/15/2019 1458   EOSABS 0.7 08/15/2019 1458   BASOSABS 0.1 08/15/2019 1458    BMET    Component Value Date/Time   NA 142 02/25/2015 1140   K 4.3 02/25/2015 1140   CL 102 02/25/2015 1140   CO2 27 02/25/2015 1140   GLUCOSE 162 (H) 02/25/2015 1140   BUN 20 02/25/2015 1140   CREATININE 1.15 02/25/2015 1140   CALCIUM 9.6 02/25/2015 1140   GFRNONAA 60 (L) 02/25/2015 1140   GFRAA >60 02/25/2015 1140    BNP No results found for: BNP  ProBNP No results found for: PROBNP  Specialty Problems       Pulmonary Problems   OSA and COPD overlap syndrome (HCC)    Allergies  Allergen Reactions   Beef-Derived Products Anaphylaxis   Lactose Intolerance (Gi) Anaphylaxis and Diarrhea    Gas   Pork-Derived Products Anaphylaxis    Immunization History  Administered Date(s) Administered   Influenza, High Dose Seasonal PF 11/07/2018   Moderna Sars-Covid-2 Vaccination 03/07/2019, 04/04/2019   Zoster Recombinat (Shingrix) 07/01/2017    Past Medical History:  Diagnosis Date   Anxiety    BPH (benign prostatic hyperplasia)    Cancer (HCC)    prostate --  borderline...only takes meds   psa was elevated   Depression    Diabetes mellitus without complication ([place])    dx 1991   Food allergy    Allergy to alpha-gal   High cholesterol    HOH (hard of hearing)    Hypertension    Pneumonia    Sleep apnea     Tobacco History: Social History   Tobacco Use  Smoking Status Former   Packs/day: 1.50   Years: 40.00   Pack years: 60.00   Types: Cigarettes   Start date: 24   Quit date: 01/19/1989   Years since quitting: 31.6  Smokeless Tobacco Never   Counseling given: Not Answered   Continue to not smoke  Outpatient Encounter Medications as of 08/13/2020  Medication Sig   Budeson-Glycopyrrol-Formoterol (BREZTRI AEROSPHERE) 160-9-4.8 MCG/ACT AERO Inhale 2 puffs into the lungs in the  morning and at bedtime.   acetaminophen (TYLENOL) 500 MG tablet Take 1,000 mg by mouth daily as needed for mild pain.   albuterol (VENTOLIN HFA) 108 (90 Base) MCG/ACT inhaler SMARTSIG:2 Puff(s) By Mouth Every 4-6 Hours PRN   ALPRAZolam (XANAX) 0.5 MG tablet Take 0.5 mg by mouth daily as needed for anxiety.   aspirin EC 81 MG tablet Take 81 mg by mouth daily.   Budeson-Glycopyrrol-Formoterol (BREZTRI AEROSPHERE) 160-9-4.8 MCG/ACT AERO Inhale 2 puffs into the lungs 2 (two) times daily. (Patient not taking: Reported on 08/13/2020)   Budeson-Glycopyrrol-Formoterol (BREZTRI AEROSPHERE) 160-9-4.8 MCG/ACT AERO Inhale 2 puffs  into the lungs in the morning and at bedtime.   buPROPion (WELLBUTRIN XL) 300 MG 24 hr tablet Take 300 mg by mouth daily.   cetirizine (ZYRTEC) 10 MG tablet Take 20 mg by mouth daily.   cyanocobalamin 1000 MCG tablet Take 1,000 mcg by mouth daily.    EPINEPHrine (EPIPEN 2-PAK) 0.3 mg/0.3 mL IJ SOAJ injection Inject 0.3 mLs (0.3 mg total) into the muscle as needed for anaphylaxis.   escitalopram (LEXAPRO) 10 MG tablet TAKE 1 TABLET BY MOUTH ONCE DAILY FOR ANXIETY   finasteride (PROSCAR) 5 MG tablet Take 5 mg by mouth daily.   fluocinonide (LIDEX) 0.05 % external solution APPLY 1 ML TOPICALLY TWICE DAILY   hydrochlorothiazide (HYDRODIURIL) 25 MG tablet Take 25 mg by mouth daily.   losartan (COZAAR) 100 MG tablet Take 100 mg by mouth daily.   lovastatin (MEVACOR) 20 MG tablet    metFORMIN (GLUCOPHAGE) 1000 MG tablet Take 500 mg by mouth daily with breakfast.    montelukast (SINGULAIR) 10 MG tablet TAKE ONE TABLET ONCE DAILY (Patient not taking: Reported on 08/13/2020)   NOVOLIN N RELION 100 UNIT/ML injection INJECT 40 TO 50 UNITS SUB Q TWICE DAILY. (TITRATE UP UNTIL FASTING AND BEDTIME GLUCOSE ARE LESS THAN 140)   Omega-3 Fatty Acids (FISH OIL) 1200 MG CAPS Take 1,200 mg by mouth 2 (two) times daily.   tamsulosin (FLOMAX) 0.4 MG CAPS capsule Take 0.4 mg by mouth daily.   triamcinolone  cream (KENALOG) 0.1 % APPLY CREAM TO RASH TWICE DAILY   No facility-administered encounter medications on file as of 08/13/2020.     Review of Systems  Review of Systems  N/A Physical Exam  BP (!) 138/58   Pulse (!) 53   Ht '5\' 9"'$  (1.753 m)   Wt (!) 300 lb 9.6 oz (136.4 kg)   SpO2 98%   BMI 44.39 kg/m   Wt Readings from Last 5 Encounters:  08/13/20 (!) 300 lb 9.6 oz (136.4 kg)  12/08/19 293 lb 6 oz (133.1 kg)  09/13/19 297 lb 3.2 oz (134.8 kg)  08/15/19 (!) 292 lb 12.8 oz (132.8 kg)  08/03/17 264 lb 12.8 oz (120.1 kg)    BMI Readings from Last 5 Encounters:  08/13/20 44.39 kg/m  12/08/19 43.32 kg/m  09/13/19 43.89 kg/m  08/15/19 43.87 kg/m  08/03/17 40.26 kg/m     Physical Exam General: Obese, no acute distress Respiratory:clear to auscultation, good air movement Abdomen: soft, bowel sounds present Neuro: Normal gait, no weakness   Assessment & Plan:    Mr. Leibfried is a 85 year old man with OSA, insulin-dependent diabetes, history of tobacco abuse (60-80-pack-year history) who resolve with recurrent wheeze and dyspnea on exertion.  Recurrent exacerbations requiring prednisone.  Further work-up demonstrated mixed restrictive (obesity)/obstructive physiology (COPD/asthma) with pseudonormalization of the FEV1/FVC ratio with moderate restriction on lung volumes with severely reduced ERV as well as evidence of gas trapping with elevated RV/TLC ratio.  Labs notable for elevated eosinophil count of 700.  Feel his symptoms reflect the combination of restrictive ventilatory defect in the setting obesity but in large part due to likely underlying COPD in the setting of tobacco abuse and undiagnosed asthma with eosinophilic phenotype.  Suspect cough related to underlying asthma/COPD.  Improved  on triple inhaled therapy. Given recurrent exacerbations and eosinophilic phenotype, started nucala 10/13/19. Continues on tripled inhaled therapy Judithann Sauger). Breathing is stable, improved.   No exacerbations.  Breztri refilled today.   Return in about 6 months (around 02/13/2021).   Rodman Key  Billey Co, MD 08/21/2020

## 2020-08-29 ENCOUNTER — Telehealth: Payer: Self-pay | Admitting: Pulmonary Disease

## 2020-08-30 NOTE — Telephone Encounter (Signed)
Forwarding to [employer] for f/u on this since she works with [employer], thanks!

## 2020-09-03 NOTE — Telephone Encounter (Signed)
Form was completed and faxed to the manufacturer. Will await the confirmation fax.

## 2020-10-21 ENCOUNTER — Other Ambulatory Visit: Payer: Self-pay | Admitting: Pulmonary Disease

## 2020-10-21 DIAGNOSIS — J8283 Eosinophilic asthma: Secondary | ICD-10-CM

## 2020-10-21 DIAGNOSIS — J449 Chronic obstructive pulmonary disease, unspecified: Secondary | ICD-10-CM

## 2020-10-21 DIAGNOSIS — G4733 Obstructive sleep apnea (adult) (pediatric): Secondary | ICD-10-CM

## 2020-11-01 ENCOUNTER — Telehealth: Payer: Self-pay | Admitting: Pulmonary Disease

## 2020-11-01 NOTE — Telephone Encounter (Signed)
No contraindications. He can get the flu shot while on nucala.

## 2020-11-01 NOTE — Telephone Encounter (Signed)
Call made to patient, confirmed DOB. Made aware there is no contraindication and he can get his flu shot. Voiced understanding.   Nothing further needed at this time.

## 2020-11-01 NOTE — Telephone Encounter (Signed)
Call made to [facility] patient wife, confirmed DOB. She wanted to be sure there was no contraindication of the patient getting a FLU shot while also taking Nucala injections.   ND please advise. Thanks :)

## 2020-11-07 ENCOUNTER — Encounter (INDEPENDENT_AMBULATORY_CARE_PROVIDER_SITE_OTHER): Payer: Medicare Other | Admitting: Ophthalmology

## 2020-11-25 ENCOUNTER — Encounter (INDEPENDENT_AMBULATORY_CARE_PROVIDER_SITE_OTHER): Payer: Self-pay | Admitting: Ophthalmology

## 2020-11-25 ENCOUNTER — Ambulatory Visit (INDEPENDENT_AMBULATORY_CARE_PROVIDER_SITE_OTHER): Payer: Medicare Other | Admitting: Ophthalmology

## 2020-11-25 ENCOUNTER — Other Ambulatory Visit: Payer: Self-pay

## 2020-11-25 DIAGNOSIS — G4733 Obstructive sleep apnea (adult) (pediatric): Secondary | ICD-10-CM | POA: Diagnosis not present

## 2020-11-25 DIAGNOSIS — E113491 Type 2 diabetes mellitus with severe nonproliferative diabetic retinopathy without macular edema, right eye: Secondary | ICD-10-CM | POA: Diagnosis not present

## 2020-11-25 DIAGNOSIS — J449 Chronic obstructive pulmonary disease, unspecified: Secondary | ICD-10-CM | POA: Diagnosis not present

## 2020-11-25 DIAGNOSIS — E113552 Type 2 diabetes mellitus with stable proliferative diabetic retinopathy, left eye: Secondary | ICD-10-CM | POA: Diagnosis not present

## 2020-11-25 NOTE — Progress Notes (Signed)
11/25/2020     CHIEF COMPLAINT Patient presents for  Chief Complaint  Patient presents with   Retina Follow Up    4 Mo F/U OU  Pt denies noticeable changes to [facility] OU since last visit. Pt denies ocular pain, flashes of light, or floaters OU.   A1c: 7.8, 3 mo ago LBS: did not check      HISTORY OF PRESENT ILLNESS: Nathaniel Gray. is a 85 y.o. male who presents to the clinic today for:   HPI     Retina Follow Up   Patient presents with  Diabetic Retinopathy.  In right eye.  This started 4 months ago.  Severity is moderate.  Duration of 4 months.  Since onset it is stable. Additional comments: 4 Mo F/U OU  Pt denies noticeable changes to [place] OU since last visit. Pt denies ocular pain, flashes of light, or floaters OU.   A1c: 7.8, 3 mo ago LBS: did not check        Comments   6 mos fu OU oct fp. Pt states "I work on my computer, it is harder to read the computer screen. I might have to change my glasses. I see Dr. Herbert Deaner for my glasses."       Last edited by Laurin Coder on 11/25/2020  2:04 PM.      Referring physician: Monna Fam, MD [place],  [place] [address]  HISTORICAL INFORMATION:   Selected notes from the [place]: No current outpatient medications on file. (Ophthalmic Drugs)   No current facility-administered medications for this visit. (Ophthalmic Drugs)   Current Outpatient Medications (Other)  Medication Sig   acetaminophen (TYLENOL) 500 MG tablet Take 1,000 mg by mouth daily as needed for mild pain.   albuterol (VENTOLIN HFA) 108 (90 Base) MCG/ACT inhaler SMARTSIG:2 Puff(s) By Mouth Every 4-6 Hours PRN   ALPRAZolam (XANAX) 0.5 MG tablet Take 0.5 mg by mouth daily as needed for anxiety.   aspirin EC 81 MG tablet Take 81 mg by mouth daily.   BREZTRI AEROSPHERE 160-9-4.8 MCG/ACT AERO INHALE 2 PUFFS INTO LUNGS TWICE DAILY   Budeson-Glycopyrrol-Formoterol (BREZTRI AEROSPHERE)  160-9-4.8 MCG/ACT AERO Inhale 2 puffs into the lungs in the morning and at bedtime.   Budeson-Glycopyrrol-Formoterol (BREZTRI AEROSPHERE) 160-9-4.8 MCG/ACT AERO Inhale 2 puffs into the lungs in the morning and at bedtime.   buPROPion (WELLBUTRIN XL) 300 MG 24 hr tablet Take 300 mg by mouth daily.   cetirizine (ZYRTEC) 10 MG tablet Take 20 mg by mouth daily.   cyanocobalamin 1000 MCG tablet Take 1,000 mcg by mouth daily.    EPINEPHrine (EPIPEN 2-PAK) 0.3 mg/0.3 mL IJ SOAJ injection Inject 0.3 mLs (0.3 mg total) into the muscle as needed for anaphylaxis.   escitalopram (LEXAPRO) 10 MG tablet TAKE 1 TABLET BY MOUTH ONCE DAILY FOR ANXIETY   finasteride (PROSCAR) 5 MG tablet Take 5 mg by mouth daily.   fluocinonide (LIDEX) 0.05 % external solution APPLY 1 ML TOPICALLY TWICE DAILY   hydrochlorothiazide (HYDRODIURIL) 25 MG tablet Take 25 mg by mouth daily.   losartan (COZAAR) 100 MG tablet Take 100 mg by mouth daily.   lovastatin (MEVACOR) 20 MG tablet    metFORMIN (GLUCOPHAGE) 1000 MG tablet Take 500 mg by mouth daily with breakfast.    montelukast (SINGULAIR) 10 MG tablet TAKE ONE TABLET ONCE DAILY (Patient not taking: Reported on 08/13/2020)   NOVOLIN N RELION  100 UNIT/ML injection INJECT 40 TO 50 UNITS SUB Q TWICE DAILY. (TITRATE UP UNTIL FASTING AND BEDTIME GLUCOSE ARE LESS THAN 140)   Omega-3 Fatty Acids (FISH OIL) 1200 MG CAPS Take 1,200 mg by mouth 2 (two) times daily.   tamsulosin (FLOMAX) 0.4 MG CAPS capsule Take 0.4 mg by mouth daily.   triamcinolone cream (KENALOG) 0.1 % APPLY CREAM TO RASH TWICE DAILY   No current facility-administered medications for this visit. (Other)      REVIEW OF SYSTEMS:    ALLERGIES Allergies  Allergen Reactions   Beef-Derived Products Anaphylaxis   Lactose Intolerance (Gi) Anaphylaxis and Diarrhea    Gas   Pork-Derived Products Anaphylaxis    PAST MEDICAL HISTORY Past Medical History:  Diagnosis Date   Anxiety    BPH (benign prostatic  hyperplasia)    Cancer (HCC)    prostate --  borderline...only takes meds   psa was elevated   Depression    Diabetes mellitus without complication ([place])    dx 1991   Food allergy    Allergy to alpha-gal   High cholesterol    HOH (hard of hearing)    Hypertension    Pneumonia    Sleep apnea    Past Surgical History:  Procedure Laterality Date   EYE SURGERY     cataracts bilateral.   macular pucker   LUMBAR LAMINECTOMY/DECOMPRESSION MICRODISCECTOMY N/A 03/04/2015   Procedure: Lumbar two-Lumbar five decompressive lumbar laminectomies;  Surgeon: Jovita Gamma, MD;  Location: [facility];  Service: Neurosurgery;  Laterality: N/A;   TONSILLECTOMY     VASECTOMY      FAMILY HISTORY Family History  Problem Relation Age of Onset   Food Allergy Father     SOCIAL HISTORY Social History   Tobacco Use   Smoking status: Former    Packs/day: 1.50    Years: 40.00    Pack years: 60.00    Types: Cigarettes    Start date: 1951    Quit date: 01/19/1989    Years since quitting: 31.8   Smokeless tobacco: Never  Vaping Use   Vaping Use: Never used  Substance Use Topics   Alcohol use: Yes    Comment: Occassionally    Drug use: No         OPHTHALMIC EXAM:  Base Eye Exam     Visual Acuity (ETDRS)       Right Left   Dist cc 20/30 20/30 -1    Correction: Glasses         Tonometry (Tonopen, 2:07 PM)       Right Left   Pressure 13 10         Pupils       Pupils Dark Light Shape React APD   Right PERRL 5 4 Round Brisk None   Left PERRL 5 4 Round Brisk None         Extraocular Movement       Right Left    Full Full         Neuro/Psych     Oriented x3: Yes   Mood/Affect: Normal         Dilation     Both eyes: 1.0% Mydriacyl, 2.5% Phenylephrine @ 2:07 PM           Slit Lamp and Fundus Exam     External Exam       Right Left   External Normal Normal         Slit Lamp Exam  Right Left   Lids/Lashes Normal Normal    Conjunctiva/Sclera White and quiet White and quiet   Cornea Clear Clear   Anterior Chamber Deep and quiet Deep and quiet   Iris Round and reactive Round and reactive   Lens Posterior chamber intraocular lens Posterior chamber intraocular lens   Anterior Vitreous Normal Normal         Fundus Exam       Right Left   Posterior Vitreous Normal, clear avitric Normal, clear avitric   Disc Normal Normal   C/D Ratio 0.6 0.6   Macula Microaneurysms, no clinically significant macular edema Microaneurysms, no clinically significant macular edema, Epiretinal membrane, with mild topographic distortion   Vessels NPDR-Severe PDR-quiet   Periphery Normal, extensive non perfusion, moderate scatter PRP temporally, and nasally, no active disease progression Good PRP            IMAGING AND PROCEDURES  Imaging and Procedures for 11/25/20  OCT, Retina - OU - Both Eyes       Right Eye Quality was good. Scan locations included subfoveal. Central Foveal Thickness: 308. Progression has been stable. Findings include abnormal foveal contour.   Left Eye Quality was good. Scan locations included subfoveal. Central Foveal Thickness: 357. Progression has been stable. Findings include epiretinal membrane, abnormal foveal contour.   Notes No active diabetic maculopathy in either eye, Moderate epiretinal membrane is noted left eye with no foveal distortion and good acuity     Color Fundus Photography Optos - OU - Both Eyes       Right Eye Progression has been stable. Disc findings include normal observations. Macula : normal observations.   Left Eye Progression has been stable. Macula : epiretinal membrane.   Notes Severe nonproliferative diabetic retinopathy with less extensive peripheral hemorrhages now status post moderate scatter PRP, temporal quadrant, nasal quadrant OS, PDR quiet good PRP             ASSESSMENT/PLAN:  OSA and COPD overlap syndrome (HCC) Excellently compliant on  CPAP  Stable treated proliferative diabetic retinopathy of left eye determined by examination associated with type 2 diabetes mellitus (HCC) OS, no signs of recurrent CSME.  PDR is totally quiescent.  Minor epiretinal membrane not requiring intervention at this time  Severe nonproliferative diabetic retinopathy of right eye (HCC) Severe NPDR OD, with only moderate scatter PRP peripherally particularly temporally. No signs of neovascularization elsewhere watch temporal macula.  OD, on current OSA treatment with CPAP, likely to slow the progression as well as continued blood sugar control and monitoring     ICD-10-CM   1. Severe nonproliferative diabetic retinopathy of right eye without macular edema associated with type 2 diabetes mellitus (HCC)  E11.3491 OCT, Retina - OU - Both Eyes    Color Fundus Photography Optos - OU - Both Eyes    2. Stable treated proliferative diabetic retinopathy of left eye determined by examination associated with type 2 diabetes mellitus ([place])  I96.7893 OCT, Retina - OU - Both Eyes    Color Fundus Photography Optos - OU - Both Eyes    3. OSA and COPD overlap syndrome (HCC)  G47.33    J44.9       1.  PDR OS quiescent.  Observe  Epiretinal membrane OS nondistorted will observe ,thickness overall stable  2.  OD with severe NPDR yet no progression  3.  Ophthalmic Meds Ordered this visit:  No orders of the defined types were placed in this encounter.      Return  in about 6 months (around 05/25/2021) for COLOR FP, OCT, DILATE OU.  There are no Patient Instructions on file for this visit.   Explained the diagnoses, plan, and follow up with the patient and they expressed understanding.  Patient expressed understanding of the importance of proper follow up care.   Clent Demark Jazmeen Axtell M.D. Diseases & Surgery of the Retina and Vitreous Retina & Diabetic [place] 11/25/20     Abbreviations: M myopia (nearsighted); A astigmatism; H hyperopia  (farsighted); P presbyopia; Mrx spectacle prescription;  CTL contact lenses; OD right eye; OS left eye; OU both eyes  XT exotropia; ET esotropia; PEK punctate epithelial keratitis; PEE punctate epithelial erosions; DES dry eye syndrome; MGD meibomian gland dysfunction; ATs artificial tears; PFAT's preservative free artificial tears; [place] nuclear sclerotic cataract; PSC posterior subcapsular cataract; ERM epi-retinal membrane; PVD posterior vitreous detachment; RD retinal detachment; DM diabetes mellitus; DR diabetic retinopathy; NPDR non-proliferative diabetic retinopathy; PDR proliferative diabetic retinopathy; CSME clinically significant macular edema; DME diabetic macular edema; dbh dot blot hemorrhages; CWS cotton wool spot; POAG primary open angle glaucoma; C/D cup-to-disc ratio; HVF humphrey visual field; GVF goldmann visual field; OCT optical coherence tomography; IOP intraocular pressure; BRVO Branch retinal vein occlusion; CRVO central retinal vein occlusion; CRAO central retinal artery occlusion; BRAO branch retinal artery occlusion; RT retinal tear; SB scleral buckle; PPV pars plana vitrectomy; VH Vitreous hemorrhage; PRP panretinal laser photocoagulation; IVK intravitreal kenalog; VMT vitreomacular traction; MH Macular hole;  NVD neovascularization of the disc; NVE neovascularization elsewhere; AREDS age related eye disease study; ARMD age related macular degeneration; POAG primary open angle glaucoma; EBMD epithelial/anterior basement membrane dystrophy; ACIOL anterior chamber intraocular lens; IOL intraocular lens; PCIOL posterior chamber intraocular lens; Phaco/IOL phacoemulsification with intraocular lens placement; [place] photorefractive keratectomy; LASIK laser assisted in situ keratomileusis; HTN hypertension; DM diabetes mellitus; COPD chronic obstructive pulmonary disease

## 2020-11-25 NOTE — Assessment & Plan Note (Signed)
Excellently compliant on CPAP

## 2020-11-25 NOTE — Assessment & Plan Note (Signed)
Severe NPDR OD, with only moderate scatter PRP peripherally particularly temporally. No signs of neovascularization elsewhere watch temporal macula.  OD, on current OSA treatment with CPAP, likely to slow the progression as well as continued blood sugar control and monitoring

## 2020-11-25 NOTE — Assessment & Plan Note (Signed)
OS, no signs of recurrent CSME.  PDR is totally quiescent.  Minor epiretinal membrane not requiring intervention at this time

## 2020-12-18 ENCOUNTER — Telehealth: Payer: Self-pay | Admitting: Pharmacist

## 2020-12-18 NOTE — Telephone Encounter (Signed)
Received call from [name] at [employer] to [place] regarding patient's PAP renewal application for Nucala.  Provided correct fax number to send provider from for renewal. Per [name], patient's portion of application was mailed on 11/19/20  Called patient - he states he will plan to complete and drop off to clinic in the next few days.  Knox Saliva, PharmD, MPH, BCPS Clinical Pharmacist (Rheumatology and Pulmonology)

## 2020-12-19 ENCOUNTER — Other Ambulatory Visit (HOSPITAL_COMMUNITY): Payer: Self-pay

## 2020-12-19 NOTE — Telephone Encounter (Signed)
Provider portion to be placed in Dr. Kavin Leech box for signature. Remaining paperwork compiled and placed in "Awaiting response" folder.

## 2020-12-24 NOTE — Telephone Encounter (Signed)
Provider portion received for Nucala PAP renewal application. Called patient and he states they will plan to drop likely this Thursday, 12/26/20. Patient aware to notify front desk that application is for pharmacy team.  Knox Saliva, PharmD, MPH, BCPS Clinical Pharmacist (Rheumatology and Pulmonology)

## 2021-01-01 ENCOUNTER — Other Ambulatory Visit (HOSPITAL_COMMUNITY): Payer: Self-pay

## 2021-01-01 NOTE — Telephone Encounter (Signed)
Submitted Patient Assistance Application to [facility] for NUCALA along with provider portion, PA and income documents. Will update patient when we receive a response.  Fax# [phone] Phone# [phone] 

## 2021-01-07 ENCOUNTER — Telehealth: Payer: Self-pay | Admitting: Pulmonary Disease

## 2021-01-07 NOTE — Telephone Encounter (Signed)
For documentation purposes.

## 2021-01-07 NOTE — Telephone Encounter (Signed)
Contacted [facility] to determine what exactly was needed. Rep also expressed confusion as to why we were contacted rather than pt. I inquired about the imposed time limit for the request, rep states that the distribution pharmacy will be closing today at noon and will not be able to process any refills again until January 3rd.  Reached out and spoke with pt, explained that he needed to contact [facility] if he was due for a refill. Pt stated he would call "in a little while", I emphasized sense of urgency if he would be due for medication prior to 01/21/21. Pt verbalized understanding.   Nothing further needed at this time.

## 2021-01-29 NOTE — Telephone Encounter (Signed)
Per [facility] [place], patient's Nucala PAP renewal application has been received and is being reviewed. Application is complete, but may take additional business days to process   Knox Saliva, PharmD, MPH, BCPS Clinical Pharmacist (Rheumatology and Pulmonology)

## 2021-02-05 NOTE — Telephone Encounter (Signed)
Per rep at [facility] [place], PAP renewal application is still processing.  Knox Saliva, PharmD, MPH, BCPS Clinical Pharmacist (Rheumatology and Pulmonology)

## 2021-02-17 NOTE — Telephone Encounter (Signed)
Received a fax from  [place] to [place] regarding an approval for [place] patient assistance from 02/14/21 to 01/18/22.   Phone number: [phone]  Approval letter sent to media tab along w PAP application  Knox Saliva, PharmD, MPH, BCPS Clinical Pharmacist (Rheumatology and Pulmonology)/

## 2021-02-27 ENCOUNTER — Encounter: Payer: Self-pay | Admitting: Pulmonary Disease

## 2021-02-27 ENCOUNTER — Ambulatory Visit: Payer: Medicare Other | Admitting: Pulmonary Disease

## 2021-02-27 ENCOUNTER — Other Ambulatory Visit: Payer: Self-pay

## 2021-02-27 VITALS — BP 116/74 | HR 50 | Ht 69.0 in | Wt 298.0 lb

## 2021-02-27 DIAGNOSIS — J449 Chronic obstructive pulmonary disease, unspecified: Secondary | ICD-10-CM

## 2021-02-27 DIAGNOSIS — G4733 Obstructive sleep apnea (adult) (pediatric): Secondary | ICD-10-CM

## 2021-02-27 DIAGNOSIS — L209 Atopic dermatitis, unspecified: Secondary | ICD-10-CM

## 2021-02-27 DIAGNOSIS — J8283 Eosinophilic asthma: Secondary | ICD-10-CM | POA: Diagnosis not present

## 2021-02-27 NOTE — Progress Notes (Signed)
Patient ID: Nathaniel Gray., male    DOB: 1935/11/11, 86 y.o.   MRN: [id number]  Chief Complaint  Patient presents with   Follow-up    6 mo f/u. States he has been doing well since last visit.     Referring provider: Cyndi Bender, PA-C  HPI:  Mr. Murdoch is a 86 year old man with OSA, insulin-dependent diabetes, history of tobacco abuse (60-80-pack-year history) who resolve with recurrent wheeze and dyspnea on exertion.  Recurrent exacerbations requiring prednisone.  Further work-up demonstrated mixed restrictive (obesity)/obstructive physiology (COPD/asthma) with pseudonormalization of the FEV1/FVC ratio with moderate restriction on lung volumes with severely reduced ERV as well as evidence of gas trapping with elevated RV/TLC ratio.  Labs notable for elevated eosinophil count of 700.   02/27/2021  - Visit  Today he is feeling well.  Breathing stable. Remains on Nucala to great effect, no exacerbation since starting.  He reports good adherence to his main inhaler regimen.  Rare if any use of albuterol.  No exacerbations.  Since last visit he has been started on Dupixent for atopic dermatitis.  Still continuing Nucala.  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   Questionaires / Pulmonary Flowsheets:   ACT:  No flowsheet data found.  MMRC: No flowsheet data found.  Epworth:  No flowsheet data found.  Tests:   FENO:  No results found for: NITRICOXIDE  PFT: Interpreted as mixed obstructive with pseudonormalization of FEV1 to FVC ratio as well as restrictive physiology given moderately decreased TLC, RV to TLC ratio of 133% predicted suggestive of gas trapping.  DLCO was moderately reduced. PFT Results Latest Ref Rng & Units 09/05/2019  FVC-Pre L 2.46  FVC-Predicted Pre % 65  FVC-Post L 2.25  FVC-Predicted Post % 59  Pre FEV1/FVC % % 77  Post FEV1/FCV % % 80  FEV1-Pre L 1.90  FEV1-Predicted  Pre % 71  FEV1-Post L 1.81  DLCO uncorrected ml/min/mmHg 15.32  DLCO UNC% % 65  DLCO corrected ml/min/mmHg 17.02  DLCO COR %Predicted % 72  DLVA Predicted % 101  TLC L 5.02  TLC % Predicted % 73  RV % Predicted % 100    WALK:  Interpretation as follows: Patient ambulated 272 m.  He had to stop in the middle of the test due to back pain shortness of breath and eventually end of the walk early due to low back pain.  At the start of test, heart rate 69, BP 120/70, O2 sat 98 percent.  Oxygen nadir to 90% at end of test.  At end of test blood pressure 140/90, heart rate 94.  Upon resting, heart rate 82, blood pressure 130/84, oxygen saturation 97%.  Test demonstrates normal increase in heart rate and blood pressure but is abnormal with decrease in oxygen saturation to 90%.  No supplemental oxygen required during test. SIX MIN WALK 09/13/2019  Medications Breztri inhaler 160-9-4.27mcg and Metformin 1000mg  at 8:30  Supplimental Oxygen during Test? (L/min) No  Laps 8  Partial Lap (in Meters) 0  Baseline BP (sitting) 120/70  Baseline Heartrate 69  Baseline Dyspnea (Borg Scale) 0  Baseline Fatigue (Borg Scale) 0  Baseline SPO2 98  BP (sitting) 140/90  Heartrate 94  Dyspnea (Borg Scale) 4  Fatigue (Borg Scale) 1  SPO2 90  BP (sitting) 130/84  Heartrate 82  SPO2 97  Stopped or Paused before Six Minutes Yes  Other Symptoms at end of Exercise  Patient had to pause with 27min 27sec remaining for 32minutes due to back pain. Patient then had to fully stop walk with 1 min 40 sec remaining due to back pain and SOB  Distance Completed 272  Distance Completed 0  Tech Comments: Pt walked at a moderate pace having complaints of back pain. Pt was not able to complete entire 6 minutes due to back pain and complaints of SOB.    Imaging: No results found.  Lab Results: Personally reviewed CBC    Component Value Date/Time   WBC 8.8 08/15/2019 1458   RBC 3.96 (L) 08/15/2019 1458   HGB 11.5 (L)  08/15/2019 1458   HCT 34.2 (L) 08/15/2019 1458   PLT 146.0 (L) 08/15/2019 1458   MCV 86.3 08/15/2019 1458   MCH 29.0 02/25/2015 1140   MCHC 33.5 08/15/2019 1458   RDW 14.5 08/15/2019 1458   LYMPHSABS 2.4 08/15/2019 1458   MONOABS 0.8 08/15/2019 1458   EOSABS 0.7 08/15/2019 1458   BASOSABS 0.1 08/15/2019 1458    BMET    Component Value Date/Time   NA 142 02/25/2015 1140   K 4.3 02/25/2015 1140   CL 102 02/25/2015 1140   CO2 27 02/25/2015 1140   GLUCOSE 162 (H) 02/25/2015 1140   BUN 20 02/25/2015 1140   CREATININE 1.15 02/25/2015 1140   CALCIUM 9.6 02/25/2015 1140   GFRNONAA 60 (L) 02/25/2015 1140   GFRAA >60 02/25/2015 1140    BNP No results found for: BNP  ProBNP No results found for: PROBNP  Specialty Problems       Pulmonary Problems   OSA and COPD overlap syndrome ([place])    Patient on CPAP use roughly 2 years       Allergies  Allergen Reactions   Beef-Derived Products Anaphylaxis   Lactose Intolerance (Gi) Anaphylaxis and Diarrhea    Gas   Pork-Derived Products Anaphylaxis    Immunization History  Administered Date(s) Administered   Influenza, High Dose Seasonal PF 11/07/2018   Moderna Sars-Covid-2 Vaccination 03/07/2019, 04/04/2019   Zoster Recombinat (Shingrix) 07/01/2017    Past Medical History:  Diagnosis Date   Anxiety    BPH (benign prostatic hyperplasia)    Cancer (HCC)    prostate --  borderline...only takes meds   psa was elevated   Depression    Diabetes mellitus without complication ([place])    dx 1991   Food allergy    Allergy to alpha-gal   High cholesterol    HOH (hard of hearing)    Hypertension    Pneumonia    Sleep apnea     Tobacco History: Social History   Tobacco Use  Smoking Status Former   Packs/day: 1.50   Years: 40.00   Pack years: 60.00   Types: Cigarettes   Start date: 17   Quit date: 01/19/1989   Years since quitting: 32.1  Smokeless Tobacco Never   Counseling given: Not Answered   Continue to not  smoke  Outpatient Encounter Medications as of 02/27/2021  Medication Sig   acetaminophen (TYLENOL) 500 MG tablet Take 1,000 mg by mouth daily as needed for mild pain.   albuterol (VENTOLIN HFA) 108 (90 Base) MCG/ACT inhaler SMARTSIG:2 Puff(s) By Mouth Every 4-6 Hours PRN   ALPRAZolam (XANAX) 0.5 MG tablet Take 0.5 mg by mouth daily as needed for anxiety.   aspirin EC 81 MG tablet Take 81 mg by mouth daily.   Budeson-Glycopyrrol-Formoterol (BREZTRI AEROSPHERE) 160-9-4.8 MCG/ACT AERO Inhale 2 puffs into the lungs in the morning  and at bedtime.   buPROPion (WELLBUTRIN XL) 300 MG 24 hr tablet Take 300 mg by mouth daily.   cetirizine (ZYRTEC) 10 MG tablet Take 20 mg by mouth daily.   cyanocobalamin 1000 MCG tablet Take 1,000 mcg by mouth daily.    Dupilumab (DUPIXENT [place]) Inject into the skin.   EPINEPHrine (EPIPEN 2-PAK) 0.3 mg/0.3 mL IJ SOAJ injection Inject 0.3 mLs (0.3 mg total) into the muscle as needed for anaphylaxis.   escitalopram (LEXAPRO) 10 MG tablet TAKE 1 TABLET BY MOUTH ONCE DAILY FOR ANXIETY   finasteride (PROSCAR) 5 MG tablet Take 5 mg by mouth daily.   fluocinonide (LIDEX) 0.05 % external solution APPLY 1 ML TOPICALLY TWICE DAILY   hydrochlorothiazide (HYDRODIURIL) 25 MG tablet Take 25 mg by mouth daily.   insulin glargine (LANTUS) 100 UNIT/ML injection Inject 74 Units into the skin 2 (two) times daily.   losartan (COZAAR) 100 MG tablet Take 100 mg by mouth daily.   lovastatin (MEVACOR) 20 MG tablet    metFORMIN (GLUCOPHAGE) 500 MG tablet Take 500 mg by mouth 2 (two) times daily with a meal.   montelukast (SINGULAIR) 10 MG tablet TAKE ONE TABLET ONCE DAILY   Omega-3 Fatty Acids (FISH OIL) 1200 MG CAPS Take 1,200 mg by mouth 2 (two) times daily.   tamsulosin (FLOMAX) 0.4 MG CAPS capsule Take 0.4 mg by mouth daily.   triamcinolone cream (KENALOG) 0.1 % APPLY CREAM TO RASH TWICE DAILY   [DISCONTINUED] BREZTRI AEROSPHERE 160-9-4.8 MCG/ACT AERO INHALE 2 PUFFS INTO LUNGS TWICE DAILY    [DISCONTINUED] Budeson-Glycopyrrol-Formoterol (BREZTRI AEROSPHERE) 160-9-4.8 MCG/ACT AERO Inhale 2 puffs into the lungs in the morning and at bedtime.   [DISCONTINUED] metFORMIN (GLUCOPHAGE) 1000 MG tablet Take 500 mg by mouth daily with breakfast.    [DISCONTINUED] NOVOLIN N RELION 100 UNIT/ML injection INJECT 40 TO 50 UNITS SUB Q TWICE DAILY. (TITRATE UP UNTIL FASTING AND BEDTIME GLUCOSE ARE LESS THAN 140)   No facility-administered encounter medications on file as of 02/27/2021.     Review of Systems  Review of Systems  N/A Physical Exam  BP 116/74    Pulse (!) 50    Ht 5\' 9"  (1.753 m)    Wt 298 lb (135.2 kg)    SpO2 99% Comment: on RA   BMI 44.01 kg/m   Wt Readings from Last 5 Encounters:  02/27/21 298 lb (135.2 kg)  08/13/20 (!) 300 lb 9.6 oz (136.4 kg)  12/08/19 293 lb 6 oz (133.1 kg)  09/13/19 297 lb 3.2 oz (134.8 kg)  08/15/19 (!) 292 lb 12.8 oz (132.8 kg)    BMI Readings from Last 5 Encounters:  02/27/21 44.01 kg/m  08/13/20 44.39 kg/m  12/08/19 43.32 kg/m  09/13/19 43.89 kg/m  08/15/19 43.87 kg/m     Physical Exam General: Obese, no acute distress Respiratory:clear to auscultation, good air movement Abdomen: soft, bowel sounds present Neuro: Normal gait, no weakness   Assessment & Plan:    Mr. Vera is a 86 y.o. man with OSA, insulin-dependent diabetes, history of tobacco abuse (60-80-pack-year history) who resolve with recurrent wheeze and dyspnea on exertion.  Recurrent exacerbations requiring prednisone.  Further work-up demonstrated mixed restrictive (obesity)/obstructive physiology (COPD/asthma) with pseudonormalization of the FEV1/FVC ratio with moderate restriction on lung volumes with severely reduced ERV as well as evidence of gas trapping with elevated RV/TLC ratio.  Labs notable for elevated eosinophil count of 700.  Feel his symptoms reflect the combination of restrictive ventilatory defect in the setting of  obesity but in large part due to likely  underlying COPD in the setting of tobacco abuse and undiagnosed asthma with eosinophilic phenotype.  Suspect cough related to underlying asthma/COPD.  Improved  on triple inhaled therapy. Given recurrent exacerbations and eosinophilic phenotype, started nucala 10/13/19. Continues on tripled inhaled therapy (Breztri) and Nucala. Breathing is stable, improved.  No exacerbations.  Now on dupixent in addition for skin. Will contact dermatology office and simplify biologic therapy.  Called 4 PM 02/27/2021, directed to biologic director, left message as unable to speak with that individual.  Return in about 6 months (around 08/27/2021).   Lanier Clam, MD 02/27/2021

## 2021-02-27 NOTE — Patient Instructions (Signed)
Good to see you again, glad you are doing well  I will contact the dermatology office and clarify the injection medications.  No changes to medications for now.  Return to clinic in 6 months or sooner as needed with Dr. Silas Flood, please call if you need any refills

## 2021-04-02 ENCOUNTER — Telehealth: Payer: Self-pay | Admitting: Pulmonary Disease

## 2021-04-02 NOTE — Telephone Encounter (Signed)
Called and spoke with Nathaniel Gray and advised her that Dr Silas Flood would return her call on Friday when he is available. I told her that he is off and unavailable to speak today or tomorrow.  ? ?[place] advise when you are free please ? ?Her number is [phone] ?

## 2021-05-13 ENCOUNTER — Telehealth: Payer: Self-pay | Admitting: Pulmonary Disease

## 2021-05-14 NOTE — Telephone Encounter (Signed)
Called and spoke with patient and he is wanting to know about the dupixient and the nucala.  ? ?He states that he feels better when he is on both medications! ? ?He also wants to know if you spoke to the dermatologist about the nucala? ? ?Patient wants to know if he can remain on both medications ? ?Please advise MH.  ?

## 2021-05-14 NOTE — Telephone Encounter (Signed)
Ok to continue on both medications. I have been unsuccessful contacting the dermatology office after playing phone tag.

## 2021-05-15 NOTE — Telephone Encounter (Signed)
Called and spoke to [place] and patient and let them know that [facility] is still trying to get in contact with the dermatology office but for now patient is to remain on both medications. Patient verbalized understanding. Nothing further needed at this time  ?

## 2021-05-26 ENCOUNTER — Ambulatory Visit (INDEPENDENT_AMBULATORY_CARE_PROVIDER_SITE_OTHER): Payer: Medicare Other | Admitting: Ophthalmology

## 2021-05-26 ENCOUNTER — Encounter (INDEPENDENT_AMBULATORY_CARE_PROVIDER_SITE_OTHER): Payer: Self-pay | Admitting: Ophthalmology

## 2021-05-26 ENCOUNTER — Encounter (INDEPENDENT_AMBULATORY_CARE_PROVIDER_SITE_OTHER): Payer: Medicare Other | Admitting: Ophthalmology

## 2021-05-26 DIAGNOSIS — Z9889 Other specified postprocedural states: Secondary | ICD-10-CM | POA: Diagnosis not present

## 2021-05-26 DIAGNOSIS — E113491 Type 2 diabetes mellitus with severe nonproliferative diabetic retinopathy without macular edema, right eye: Secondary | ICD-10-CM

## 2021-05-26 DIAGNOSIS — H35372 Puckering of macula, left eye: Secondary | ICD-10-CM | POA: Diagnosis not present

## 2021-05-26 DIAGNOSIS — E113552 Type 2 diabetes mellitus with stable proliferative diabetic retinopathy, left eye: Secondary | ICD-10-CM | POA: Diagnosis not present

## 2021-05-26 NOTE — Assessment & Plan Note (Signed)
OD looks great, stable overall ?

## 2021-05-26 NOTE — Assessment & Plan Note (Signed)
Early severe nonoperative diabetic retinopathy with moderate scatter PRP temporally.  Larger regions of retinal nonperfusion dot blot hemorrhages are noted now 360 may one day need additional PRP, mid periphery to the regions of nonperfusion to prevent progression of PDR ?

## 2021-05-26 NOTE — Assessment & Plan Note (Signed)
OS stable overall good PRP ?

## 2021-05-26 NOTE — Assessment & Plan Note (Signed)
OS, moderate epiretinal membrane still persist OS with moderate impact on acuity.  We will continue to monitorMacular thickening in this region ?

## 2021-05-26 NOTE — Progress Notes (Signed)
? ? ?05/26/2021 ? ?  ? ?CHIEF COMPLAINT ?Patient presents for  ?Chief Complaint  ?Patient presents with  ? Diabetic Retinopathy without Macular Edema  ? ? ? ? ?HISTORY OF PRESENT ILLNESS: ?Nathaniel Gray. is a 86 y.o. male who presents to the clinic today for:  ? ?HPI   ?6 mos fu OU OCT FP. ?Patient states vision is stable and unchanged since last visit. Denies any new floaters or FOL. ?Patient states he uses a CPAP nightly. Patient states his last A1C last week was 7.1. Patient does not know his last BS.  ?No hospitalizations or surgeries since last visit. ?Last edited by Laurin Coder on 05/26/2021  1:13 PM.  ?  ? ? ?Referring physician: ?Monna Fam, MD ?[place],  [place] [address] ? ?HISTORICAL INFORMATION:  ? ?Selected notes from the [facility] ?  ?   ? ?CURRENT MEDICATIONS: ?No current outpatient medications on file. (Ophthalmic Drugs)  ? ?No current facility-administered medications for this visit. (Ophthalmic Drugs)  ? ?Current Outpatient Medications (Other)  ?Medication Sig  ? acetaminophen (TYLENOL) 500 MG tablet Take 1,000 mg by mouth daily as needed for mild pain.  ? albuterol (VENTOLIN HFA) 108 (90 Base) MCG/ACT inhaler SMARTSIG:2 Puff(s) By Mouth Every 4-6 Hours PRN  ? ALPRAZolam (XANAX) 0.5 MG tablet Take 0.5 mg by mouth daily as needed for anxiety.  ? aspirin EC 81 MG tablet Take 81 mg by mouth daily.  ? Budeson-Glycopyrrol-Formoterol (BREZTRI AEROSPHERE) 160-9-4.8 MCG/ACT AERO Inhale 2 puffs into the lungs in the morning and at bedtime.  ? buPROPion (WELLBUTRIN XL) 300 MG 24 hr tablet Take 300 mg by mouth daily.  ? cetirizine (ZYRTEC) 10 MG tablet Take 20 mg by mouth daily.  ? cyanocobalamin 1000 MCG tablet Take 1,000 mcg by mouth daily.   ? Dupilumab (DUPIXENT [place]) Inject into the skin.  ? EPINEPHrine (EPIPEN 2-PAK) 0.3 mg/0.3 mL IJ SOAJ injection Inject 0.3 mLs (0.3 mg total) into the muscle as needed for anaphylaxis.  ? escitalopram (LEXAPRO) 10 MG tablet TAKE 1  TABLET BY MOUTH ONCE DAILY FOR ANXIETY  ? finasteride (PROSCAR) 5 MG tablet Take 5 mg by mouth daily.  ? fluocinonide (LIDEX) 0.05 % external solution APPLY 1 ML TOPICALLY TWICE DAILY  ? hydrochlorothiazide (HYDRODIURIL) 25 MG tablet Take 25 mg by mouth daily.  ? insulin glargine (LANTUS) 100 UNIT/ML injection Inject 74 Units into the skin 2 (two) times daily.  ? losartan (COZAAR) 100 MG tablet Take 100 mg by mouth daily.  ? lovastatin (MEVACOR) 20 MG tablet   ? metFORMIN (GLUCOPHAGE) 500 MG tablet Take 500 mg by mouth 2 (two) times daily with a meal.  ? montelukast (SINGULAIR) 10 MG tablet TAKE ONE TABLET ONCE DAILY  ? Omega-3 Fatty Acids (FISH OIL) 1200 MG CAPS Take 1,200 mg by mouth 2 (two) times daily.  ? tamsulosin (FLOMAX) 0.4 MG CAPS capsule Take 0.4 mg by mouth daily.  ? triamcinolone cream (KENALOG) 0.1 % APPLY CREAM TO RASH TWICE DAILY  ? ?No current facility-administered medications for this visit. (Other)  ? ? ? ? ?REVIEW OF SYSTEMS: ?ROS   ?Negative for: Constitutional, Gastrointestinal, Neurological, Skin, Genitourinary, Musculoskeletal, HENT, Endocrine, Cardiovascular, Eyes, Respiratory, Psychiatric, Allergic/Imm, Heme/Lymph ?Last edited by Hurman Horn, MD on 05/26/2021  1:52 PM.  ?  ? ? ? ?ALLERGIES ?Allergies  ?Allergen Reactions  ? Beef-Derived Products Anaphylaxis  ? Lactose Intolerance (Gi) Anaphylaxis and Diarrhea  ?  Gas  ? Pork-Derived  Products Anaphylaxis  ? ? ?PAST MEDICAL HISTORY ?Past Medical History:  ?Diagnosis Date  ? Anxiety   ? BPH (benign prostatic hyperplasia)   ? Cancer [facility])   ? prostate --  borderline...only takes meds   psa was elevated  ? Depression   ? Food allergy   ? Allergy to alpha-gal  ? High cholesterol   ? HOH (hard of hearing)   ? Hypertension   ? Pneumonia   ? Sleep apnea   ? ?Past Surgical History:  ?Procedure Laterality Date  ? EYE SURGERY    ? cataracts bilateral.   macular pucker  ? LUMBAR LAMINECTOMY/DECOMPRESSION MICRODISCECTOMY N/A 03/04/2015  ? Procedure:  Lumbar two-Lumbar five decompressive lumbar laminectomies;  Surgeon: Jovita Gamma, MD;  Location: [facility];  Service: Neurosurgery;  Laterality: N/A;  ? TONSILLECTOMY    ? VASECTOMY    ? ? ?FAMILY HISTORY ?Family History  ?Problem Relation Age of Onset  ? Food Allergy Father   ? ? ?SOCIAL HISTORY ?Social History  ? ?Tobacco Use  ? Smoking status: Former  ?  Packs/day: 1.50  ?  Years: 40.00  ?  Pack years: 60.00  ?  Types: Cigarettes  ?  Start date: 85  ?  Quit date: 01/19/1989  ?  Years since quitting: 32.3  ? Smokeless tobacco: Never  ?Vaping Use  ? Vaping Use: Never used  ?Substance Use Topics  ? Alcohol use: Yes  ?  Comment: Occassionally   ? Drug use: No  ? ?  ? ?  ? ?OPHTHALMIC EXAM: ? ?Base Eye Exam   ? ? Visual Acuity (ETDRS)   ? ?   Right Left  ? Dist cc 20/30 -1 20/30 -1  ? ? Correction: Glasses  ? ?  ?  ? ? Tonometry (Tonopen, 1:09 PM)   ? ?   Right Left  ? Pressure 11 10  ? ?  ?  ? ? Pupils   ? ?   Pupils Dark Light Shape React APD  ? Right PERRL 5 4 Round Brisk None  ? Left PERRL 5 4 Round Brisk None  ? ?  ?  ? ? Visual Fields (Counting fingers)   ? ?   Left Right  ?  Full Full  ? ?  ?  ? ? Extraocular Movement   ? ?   Right Left  ?  Full Full  ? ?  ?  ? ? Neuro/Psych   ? ? Oriented x3: Yes  ? Mood/Affect: Normal  ? ?  ?  ? ? Dilation   ? ? Both eyes: 1.0% Mydriacyl, 2.5% Phenylephrine @ 1:09 PM  ? ?  ?  ? ?  ? ?Slit Lamp and Fundus Exam   ? ? External Exam   ? ?   Right Left  ? External Normal Normal  ? ?  ?  ? ? Slit Lamp Exam   ? ?   Right Left  ? Lids/Lashes Normal Normal  ? Conjunctiva/Sclera White and quiet White and quiet  ? Cornea Clear Clear  ? Anterior Chamber Deep and quiet Deep and quiet  ? Iris Round and reactive Round and reactive  ? Lens Posterior chamber intraocular lens Posterior chamber intraocular lens  ? Anterior Vitreous Normal Normal  ? ?  ?  ? ? Fundus Exam   ? ?   Right Left  ? Posterior Vitreous Normal, clear avitric Normal, clear avitric  ? Disc Normal Normal  ? C/D Ratio  0.6  0.6  ? Macula Microaneurysms, no clinically significant macular edema Microaneurysms, no clinically significant macular edema, Epiretinal membrane, with mild topographic distortion  ? Vessels NPDR-Severe PDR-quiet  ? Periphery Normal, extensive non perfusion, moderate scatter PRP temporally, and nasally, no active disease progression Good PRP  ? ?  ?  ? ?  ? ? ?IMAGING AND PROCEDURES  ?Imaging and Procedures for 05/26/21 ? ?OCT, Retina - OU - Both Eyes   ? ?   ?Right Eye ?Quality was good. Scan locations included subfoveal. Central Foveal Thickness: 311. Progression has been stable. Findings include abnormal foveal contour.  ? ?Left Eye ?Quality was good. Scan locations included subfoveal. Central Foveal Thickness: 362. Progression has been stable. Findings include epiretinal membrane, abnormal foveal contour.  ? ?Notes ?No active diabetic maculopathy in either eye, Moderate epiretinal membrane is noted left eye with no foveal distortion and good acuity ? ?  ? ?Color Fundus Photography Optos - OU - Both Eyes   ? ?   ?Right Eye ?Progression has been stable. Disc findings include normal observations. Macula : normal observations.  ? ?Left Eye ?Progression has been stable. Macula : epiretinal membrane.  ? ?Notes ?Severe nonproliferative diabetic retinopathy with less extensive peripheral hemorrhages now status post moderate scatter PRP, temporal quadrant, nasal quadrant ?OS, PDR quiet good PRP ? ?  ? ? ?  ?  ? ?  ?ASSESSMENT/PLAN: ? ?History of vitrectomy ?OD looks great, stable overall ? ?Left epiretinal membrane ?OS, moderate epiretinal membrane still persist OS with moderate impact on acuity.  We will continue to monitorMacular thickening in this region ? ?Stable treated proliferative diabetic retinopathy of left eye determined by examination associated with type 2 diabetes mellitus ([place]) ?OS stable overall good PRP ? ?Severe nonproliferative diabetic retinopathy of right eye ([place]) ?Early severe nonoperative  diabetic retinopathy with moderate scatter PRP temporally.  Larger regions of retinal nonperfusion dot blot hemorrhages are noted now 360 may one day need additional PRP, mid periphery to the regions of nonper

## 2021-06-12 ENCOUNTER — Other Ambulatory Visit: Payer: Self-pay | Admitting: Pulmonary Disease

## 2021-09-29 ENCOUNTER — Encounter: Payer: Self-pay | Admitting: Pulmonary Disease

## 2021-09-29 ENCOUNTER — Ambulatory Visit: Payer: Medicare Other | Admitting: Pulmonary Disease

## 2021-09-29 VITALS — BP 130/64 | HR 66 | Ht 69.0 in | Wt 297.6 lb

## 2021-09-29 DIAGNOSIS — J449 Chronic obstructive pulmonary disease, unspecified: Secondary | ICD-10-CM

## 2021-09-29 DIAGNOSIS — G4733 Obstructive sleep apnea (adult) (pediatric): Secondary | ICD-10-CM | POA: Diagnosis not present

## 2021-09-29 MED ORDER — MONTELUKAST SODIUM 10 MG PO TABS: 10.0000 mg | ORAL_TABLET | Freq: Every day | ORAL | 5 refills | Status: AC

## 2021-09-29 NOTE — Patient Instructions (Signed)
Nice to see you again  Stop Nucala  Continue Breztri 2 puffs twice a day every day  I sent a refill for Singulair or montelukast.  Take 1 pill at night for 30 days.  See if this helps with some of the eye symptoms.  If not you can stop this.  Return to clinic in 6 months or sooner as needed with Dr. Silas Flood

## 2021-09-29 NOTE — Progress Notes (Signed)
Noted. Nucala renewal will not be processed. Medication appears to already be removed from pt's medication list  Knox Saliva, PharmD, MPH, BCPS, CPP Clinical Pharmacist (Rheumatology and Pulmonology)

## 2021-09-29 NOTE — Progress Notes (Signed)
Patient ID: Nathaniel Gray., male    DOB: 01-28-1935, 86 y.o.   MRN: [id number]  Chief Complaint  Patient presents with   Follow-up    Pt is here for follow up for copd. Pt states that he is doing well since last office visit. Pt is on nucala and dupixent. No issues noted so far     Referring provider: Cyndi Bender, PA-C  HPI:  Nathaniel Gray is a 86 year old man with OSA, insulin-dependent diabetes, history of tobacco abuse (60-80-pack-year history) who resolve with recurrent wheeze and dyspnea on exertion.  Recurrent exacerbations requiring prednisone.  Further work-up demonstrated mixed restrictive (obesity)/obstructive physiology (COPD/asthma) with pseudonormalization of the FEV1/FVC ratio with moderate restriction on lung volumes with severely reduced ERV as well as evidence of gas trapping with elevated RV/TLC ratio.  Labs notable for elevated eosinophil count of 700.   09/29/2021  - Visit  Today he is feeling well.  Breathing stable. Remains on Nucala to great effect, no exacerbation since starting.  He reports good adherence to his Breztri inhaler regimen.  Rare if any use of albuterol.  No exacerbations.  He remains on Dupixent for atopic dermatitis.  Discussed discontinuing Nucala given Dupixent use and concomitant skin disease.  He expressed understanding.  Reports worsening eye discharge.  Bilateral.  He thinks related to allergies.  Occurs overnight.   Questionaires / Pulmonary Flowsheets:   ACT:      No data to display          MMRC:     No data to display          Epworth:      No data to display          Tests:   FENO:  No results found for: "NITRICOXIDE"  PFT: Interpreted as mixed obstructive with pseudonormalization of FEV1 to FVC ratio as well as restrictive physiology given moderately decreased TLC, RV to TLC ratio of 133% predicted suggestive of gas trapping.  DLCO was moderately reduced.    Latest Ref Rng & Units 09/05/2019    4:03 PM  PFT  Results  FVC-Pre L 2.46  P  FVC-Predicted Pre % 65  P  FVC-Post L 2.25  P  FVC-Predicted Post % 59  P  Pre FEV1/FVC % % 77  P  Post FEV1/FCV % % 80  P  FEV1-Pre L 1.90  P  FEV1-Predicted Pre % 71  P  FEV1-Post L 1.81  P  DLCO uncorrected ml/min/mmHg 15.32  P  DLCO UNC% % 65  P  DLCO corrected ml/min/mmHg 17.02  P  DLCO COR %Predicted % 72  P  DLVA Predicted % 101  P  TLC L 5.02  P  TLC % Predicted % 73  P  RV % Predicted % 100  P    P Preliminary result     WALK:  Interpretation as follows: Patient ambulated 272 m.  He had to stop in the middle of the test due to back pain shortness of breath and eventually end of the walk early due to low back pain.  At the start of test, heart rate 69, BP 120/70, O2 sat 98 percent.  Oxygen nadir to 90% at end of test.  At end of test blood pressure 140/90, heart rate 94.  Upon resting, heart rate 82, blood pressure 130/84, oxygen saturation 97%.  Test demonstrates normal increase in heart rate and blood pressure but is abnormal with decrease in oxygen saturation to 90%.  No supplemental oxygen required during test.    09/13/2019    3:23 PM  SIX MIN WALK  Medications Breztri inhaler 160-9-4.76mg and Metformin '1000mg'$  at 8:30  Supplimental Oxygen during Test? (L/min) No  Laps 8  Partial Lap (in Meters) 0 meters  Baseline BP (sitting) 120/70  Baseline Heartrate 69  Baseline Dyspnea (Borg Scale) 0  Baseline Fatigue (Borg Scale) 0  Baseline SPO2 98 %  BP (sitting) 140/90  Heartrate 94  Dyspnea (Borg Scale) 4  Fatigue (Borg Scale) 1  SPO2 90 %  BP (sitting) 130/84  Heartrate 82  SPO2 97 %  Stopped or Paused before Six Minutes Yes  Other Symptoms at end of Exercise Patient had to pause with 371m 27sec remaining for 51m54mtes due to back pain. Patient then had to fully stop walk with 1 min 40 sec remaining due to back pain and SOB  Distance Completed 272 meters  Distance Completed 0 meters  Tech Comments: Pt walked at a moderate pace having  complaints of back pain. Pt was not able to complete entire 6 minutes due to back pain and complaints of SOB.    Imaging: No results found.  Lab Results: Personally reviewed CBC    Component Value Date/Time   WBC 8.8 08/15/2019 1458   RBC 3.96 (L) 08/15/2019 1458   HGB 11.5 (L) 08/15/2019 1458   HCT 34.2 (L) 08/15/2019 1458   PLT 146.0 (L) 08/15/2019 1458   MCV 86.3 08/15/2019 1458   MCH 29.0 02/25/2015 1140   MCHC 33.5 08/15/2019 1458   RDW 14.5 08/15/2019 1458   LYMPHSABS 2.4 08/15/2019 1458   MONOABS 0.8 08/15/2019 1458   EOSABS 0.7 08/15/2019 1458   BASOSABS 0.1 08/15/2019 1458    BMET    Component Value Date/Time   NA 142 02/25/2015 1140   K 4.3 02/25/2015 1140   CL 102 02/25/2015 1140   CO2 27 02/25/2015 1140   GLUCOSE 162 (H) 02/25/2015 1140   BUN 20 02/25/2015 1140   CREATININE 1.15 02/25/2015 1140   CALCIUM 9.6 02/25/2015 1140   GFRNONAA 60 (L) 02/25/2015 1140   GFRAA >60 02/25/2015 1140    BNP No results found for: "BNP"  ProBNP No results found for: "PROBNP"  Specialty Problems       Pulmonary Problems   OSA and COPD overlap syndrome ([facility]  Patient on CPAP use roughly 2 years       Allergies  Allergen Reactions   Beef-Derived Products Anaphylaxis   Lactose Intolerance (Gi) Anaphylaxis and Diarrhea    Gas   Pork-Derived Products Anaphylaxis    Immunization History  Administered Date(s) Administered   Influenza, High Dose Seasonal PF 11/07/2018   Moderna Sars-Covid-2 Vaccination 03/07/2019, 04/04/2019   Zoster Recombinat (Shingrix) 07/01/2017    Past Medical History:  Diagnosis Date   Anxiety    BPH (benign prostatic hyperplasia)    Cancer (HCC)    prostate --  borderline...only takes meds   psa was elevated   Depression    Food allergy    Allergy to alpha-gal   High cholesterol    HOH (hard of hearing)    Hypertension    Pneumonia    Sleep apnea     Tobacco History: Social History   Tobacco Use  Smoking Status  Former   Packs/day: 1.50   Years: 40.00   Total pack years: 60.00   Types: Cigarettes   Start date: 19561Quit date: 01/19/1989   Years since quitting:  32.7  Smokeless Tobacco Never   Counseling given: Not Answered   Continue to not smoke  Outpatient Encounter Medications as of 09/29/2021  Medication Sig   acetaminophen (TYLENOL) 500 MG tablet Take 1,000 mg by mouth daily as needed for mild pain.   albuterol (VENTOLIN HFA) 108 (90 Base) MCG/ACT inhaler SMARTSIG:2 Puff(s) By Mouth Every 4-6 Hours PRN   ALPRAZolam (XANAX) 0.5 MG tablet Take 0.5 mg by mouth daily as needed for anxiety.   aspirin EC 81 MG tablet Take 81 mg by mouth daily.   BREZTRI AEROSPHERE 160-9-4.8 MCG/ACT AERO INHALE 2 PUFFS TWICE DAILY   buPROPion (WELLBUTRIN XL) 300 MG 24 hr tablet Take 300 mg by mouth daily.   cetirizine (ZYRTEC) 10 MG tablet Take 20 mg by mouth daily.   cyanocobalamin 1000 MCG tablet Take 1,000 mcg by mouth daily.    Dupilumab (DUPIXENT [place]) Inject into the skin.   EPINEPHrine (EPIPEN 2-PAK) 0.3 mg/0.3 mL IJ SOAJ injection Inject 0.3 mLs (0.3 mg total) into the muscle as needed for anaphylaxis.   escitalopram (LEXAPRO) 10 MG tablet TAKE 1 TABLET BY MOUTH ONCE DAILY FOR ANXIETY   finasteride (PROSCAR) 5 MG tablet Take 5 mg by mouth daily.   fluocinonide (LIDEX) 0.05 % external solution APPLY 1 ML TOPICALLY TWICE DAILY   hydrochlorothiazide (HYDRODIURIL) 25 MG tablet Take 25 mg by mouth daily.   insulin glargine (LANTUS) 100 UNIT/ML injection Inject 74 Units into the skin 2 (two) times daily.   losartan (COZAAR) 100 MG tablet Take 100 mg by mouth daily.   lovastatin (MEVACOR) 20 MG tablet    metFORMIN (GLUCOPHAGE) 500 MG tablet Take 500 mg by mouth 2 (two) times daily with a meal.   montelukast (SINGULAIR) 10 MG tablet Take 1 tablet (10 mg total) by mouth at bedtime.   Omega-3 Fatty Acids (FISH OIL) 1200 MG CAPS Take 1,200 mg by mouth 2 (two) times daily.   tamsulosin (FLOMAX) 0.4 MG CAPS capsule  Take 0.4 mg by mouth daily.   triamcinolone cream (KENALOG) 0.1 % APPLY CREAM TO RASH TWICE DAILY   [DISCONTINUED] montelukast (SINGULAIR) 10 MG tablet TAKE ONE TABLET ONCE DAILY   No facility-administered encounter medications on file as of 09/29/2021.     Review of Systems  Review of Systems  N/A Physical Exam  BP 130/64 (BP Location: Left Arm, Patient Position: Sitting, Cuff Size: Normal)   Pulse 66   Ht '5\' 9"'$  (1.753 m)   Wt 297 lb 9.6 oz (135 kg)   SpO2 93%   BMI 43.95 kg/m   Wt Readings from Last 5 Encounters:  09/29/21 297 lb 9.6 oz (135 kg)  02/27/21 298 lb (135.2 kg)  08/13/20 (!) 300 lb 9.6 oz (136.4 kg)  12/08/19 293 lb 6 oz (133.1 kg)  09/13/19 297 lb 3.2 oz (134.8 kg)    BMI Readings from Last 5 Encounters:  09/29/21 43.95 kg/m  02/27/21 44.01 kg/m  08/13/20 44.39 kg/m  12/08/19 43.32 kg/m  09/13/19 43.89 kg/m     Physical Exam General: Obese, no acute distress Respiratory:clear to auscultation, good air movement Abdomen: soft, bowel sounds present Neuro: Normal gait, no weakness   Assessment & Plan:    Mr. Edmister is a 86 y.o. man with OSA, insulin-dependent diabetes, history of tobacco abuse (60-80-pack-year history) who resolve with recurrent wheeze and dyspnea on exertion.  Recurrent exacerbations requiring prednisone.  Further work-up demonstrated mixed restrictive (obesity)/obstructive physiology (COPD/asthma) with pseudonormalization of the FEV1/FVC ratio with moderate restriction on  lung volumes with severely reduced ERV as well as evidence of gas trapping with elevated RV/TLC ratio.  Labs notable for elevated eosinophil count of 700.  Feel his symptoms reflect the combination of restrictive ventilatory defect in the setting of obesity but in large part due to likely underlying COPD in the setting of tobacco abuse and undiagnosed asthma with eosinophilic phenotype.  Suspect cough related to underlying asthma/COPD.  Improved  on triple inhaled  therapy. Given recurrent exacerbations and eosinophilic phenotype, started nucala 10/13/19. Continues on tripled inhaled therapy Judithann Sauger). Breathing is stable, improved.  No exacerbations.  Now on dupixent in addition for skin.  Okay to stop Nucala given well-controlled disease and Dupixent which likely will be more beneficial for skin.  With some worsening eye symptoms resume montelukast which he had stopped.  Continue Zyrtec.  See if this helps, if not okay to discontinue montelukast in the coming weeks.  For his OSA, he continues to report good adherence to CPAP therapy.  He is encouraged to continue this.   Return in about 6 months (around 03/30/2022).   Lanier Clam, MD 09/29/2021

## 2021-10-22 ENCOUNTER — Encounter (INDEPENDENT_AMBULATORY_CARE_PROVIDER_SITE_OTHER): Payer: Self-pay | Admitting: Ophthalmology

## 2021-10-22 ENCOUNTER — Ambulatory Visit (INDEPENDENT_AMBULATORY_CARE_PROVIDER_SITE_OTHER): Payer: Medicare Other | Admitting: Ophthalmology

## 2021-10-22 DIAGNOSIS — E113552 Type 2 diabetes mellitus with stable proliferative diabetic retinopathy, left eye: Secondary | ICD-10-CM

## 2021-10-22 DIAGNOSIS — G4733 Obstructive sleep apnea (adult) (pediatric): Secondary | ICD-10-CM

## 2021-10-22 DIAGNOSIS — E113491 Type 2 diabetes mellitus with severe nonproliferative diabetic retinopathy without macular edema, right eye: Secondary | ICD-10-CM

## 2021-10-22 DIAGNOSIS — H35372 Puckering of macula, left eye: Secondary | ICD-10-CM | POA: Diagnosis not present

## 2021-10-22 DIAGNOSIS — J449 Chronic obstructive pulmonary disease, unspecified: Secondary | ICD-10-CM

## 2021-10-22 DIAGNOSIS — H47291 Other optic atrophy, right eye: Secondary | ICD-10-CM | POA: Diagnosis not present

## 2021-10-22 NOTE — Assessment & Plan Note (Signed)
Compliant with CPAP 

## 2021-10-22 NOTE — Assessment & Plan Note (Signed)
Stable accounts for some vision.

## 2021-10-22 NOTE — Assessment & Plan Note (Signed)
OD with moderate scatter PRP temporally and watershed regions.  No other progression in either quadrants.  We will continue to monitor

## 2021-10-22 NOTE — Assessment & Plan Note (Signed)
Minor OS we will continue to observe

## 2021-10-22 NOTE — Progress Notes (Signed)
10/22/2021     CHIEF COMPLAINT Patient presents for  Chief Complaint  Patient presents with   Diabetic Retinopathy without Macular Edema      HISTORY OF PRESENT ILLNESS: Nathaniel Gray. is a 86 y.o. male who presents to the clinic today for:   HPI   Diabetic retinopathy of right eye without macular edema 5 mths dilate OU color fp prp OD Pt states his vision has been stable Pt denies any new floaters or FOL Last edited by Morene Rankins, CMA on 10/22/2021  1:07 PM.      Referring physician: Cyndi Bender, PA-C [place],  [place] [address]  HISTORICAL INFORMATION:   Selected notes from the [facility]: No current outpatient medications on file. (Ophthalmic Drugs)   No current facility-administered medications for this visit. (Ophthalmic Drugs)   Current Outpatient Medications (Other)  Medication Sig   acetaminophen (TYLENOL) 500 MG tablet Take 1,000 mg by mouth daily as needed for mild pain.   albuterol (VENTOLIN HFA) 108 (90 Base) MCG/ACT inhaler SMARTSIG:2 Puff(s) By Mouth Every 4-6 Hours PRN   ALPRAZolam (XANAX) 0.5 MG tablet Take 0.5 mg by mouth daily as needed for anxiety.   aspirin EC 81 MG tablet Take 81 mg by mouth daily.   BREZTRI AEROSPHERE 160-9-4.8 MCG/ACT AERO INHALE 2 PUFFS TWICE DAILY   buPROPion (WELLBUTRIN XL) 300 MG 24 hr tablet Take 300 mg by mouth daily.   cetirizine (ZYRTEC) 10 MG tablet Take 20 mg by mouth daily.   cyanocobalamin 1000 MCG tablet Take 1,000 mcg by mouth daily.    Dupilumab (DUPIXENT [place]) Inject into the skin.   EPINEPHrine (EPIPEN 2-PAK) 0.3 mg/0.3 mL IJ SOAJ injection Inject 0.3 mLs (0.3 mg total) into the muscle as needed for anaphylaxis.   escitalopram (LEXAPRO) 10 MG tablet TAKE 1 TABLET BY MOUTH ONCE DAILY FOR ANXIETY   finasteride (PROSCAR) 5 MG tablet Take 5 mg by mouth daily.   fluocinonide (LIDEX) 0.05 % external solution APPLY 1 ML TOPICALLY TWICE DAILY    hydrochlorothiazide (HYDRODIURIL) 25 MG tablet Take 25 mg by mouth daily.   insulin glargine (LANTUS) 100 UNIT/ML injection Inject 74 Units into the skin 2 (two) times daily.   losartan (COZAAR) 100 MG tablet Take 100 mg by mouth daily.   lovastatin (MEVACOR) 20 MG tablet    metFORMIN (GLUCOPHAGE) 500 MG tablet Take 500 mg by mouth 2 (two) times daily with a meal.   montelukast (SINGULAIR) 10 MG tablet Take 1 tablet (10 mg total) by mouth at bedtime.   Omega-3 Fatty Acids (FISH OIL) 1200 MG CAPS Take 1,200 mg by mouth 2 (two) times daily.   tamsulosin (FLOMAX) 0.4 MG CAPS capsule Take 0.4 mg by mouth daily.   triamcinolone cream (KENALOG) 0.1 % APPLY CREAM TO RASH TWICE DAILY   No current facility-administered medications for this visit. (Other)      REVIEW OF SYSTEMS: ROS   Negative for: Constitutional, Gastrointestinal, Neurological, Skin, Genitourinary, Musculoskeletal, HENT, Endocrine, Cardiovascular, Eyes, Respiratory, Psychiatric, Allergic/Imm, Heme/Lymph Last edited by Morene Rankins, CMA on 10/22/2021  1:07 PM.       ALLERGIES Allergies  Allergen Reactions   Beef-Derived Products Anaphylaxis   Lactose Intolerance (Gi) Anaphylaxis and Diarrhea    Gas   Pork-Derived Products Anaphylaxis    PAST MEDICAL HISTORY Past Medical History:  Diagnosis Date   Anxiety    BPH (benign prostatic hyperplasia)  Cancer [facility])    prostate --  borderline...only takes meds   psa was elevated   Depression    Food allergy    Allergy to alpha-gal   High cholesterol    HOH (hard of hearing)    Hypertension    Pneumonia    Sleep apnea    Past Surgical History:  Procedure Laterality Date   EYE SURGERY     cataracts bilateral.   macular pucker   LUMBAR LAMINECTOMY/DECOMPRESSION MICRODISCECTOMY N/A 03/04/2015   Procedure: Lumbar two-Lumbar five decompressive lumbar laminectomies;  Surgeon: Jovita Gamma, MD;  Location: [facility];  Service: Neurosurgery;  Laterality: N/A;    TONSILLECTOMY     VASECTOMY      FAMILY HISTORY Family History  Problem Relation Age of Onset   Food Allergy Father     SOCIAL HISTORY Social History   Tobacco Use   Smoking status: Former    Packs/day: 1.50    Years: 40.00    Total pack years: 60.00    Types: Cigarettes    Start date: 76    Quit date: 01/19/1989    Years since quitting: 32.7   Smokeless tobacco: Never  Vaping Use   Vaping Use: Never used  Substance Use Topics   Alcohol use: Yes    Comment: Occassionally    Drug use: No         OPHTHALMIC EXAM:  Base Eye Exam     Visual Acuity (ETDRS)       Right Left   Dist cc 20/30 -1 20/30 -1    Correction: Glasses         Tonometry (Tonopen, 1:11 PM)       Right Left   Pressure 7 7         Pupils       Pupils   Right PERRL   Left PERRL         Visual Fields       Left Right    Full Full         Extraocular Movement       Right Left    Ortho Ortho    -- -- --  --  --  -- -- --   -- -- --  --  --  -- -- --           Neuro/Psych     Oriented x3: Yes   Mood/Affect: Normal         Dilation     Both eyes: 1.0% Mydriacyl, 2.5% Phenylephrine @ 1:09 PM           Slit Lamp and Fundus Exam     External Exam       Right Left   External Normal Normal         Slit Lamp Exam       Right Left   Lids/Lashes Normal Normal   Conjunctiva/Sclera White and quiet White and quiet   Cornea Clear Clear   Anterior Chamber Deep and quiet Deep and quiet   Iris Round and reactive Round and reactive   Lens Posterior chamber intraocular lens Posterior chamber intraocular lens   Anterior Vitreous Normal Normal         Fundus Exam       Right Left   Posterior Vitreous Normal, clear avitric Normal, clear avitric   Disc Normal Normal   C/D Ratio 0.6 0.6   Macula Microaneurysms, no clinically significant macular edema Microaneurysms, no clinically significant macular  edema, Epiretinal membrane, with mild topographic  distortion   Vessels NPDR-Severe PDR-quiet   Periphery Normal, extensive non perfusion, moderate scatter PRP temporally, and nasally, no active disease progression Good PRP            IMAGING AND PROCEDURES  Imaging and Procedures for 10/22/21  Color Fundus Photography Optos - OU - Both Eyes       Right Eye Progression has been stable. Disc findings include normal observations. Macula : normal observations.   Left Eye Progression has been stable. Macula : epiretinal membrane.   Notes Severe nonproliferative diabetic retinopathy with less extensive peripheral hemorrhages now status post moderate scatter PRP, temporal quadrant, nasal quadrant OS, PDR quiet good PRP             ASSESSMENT/PLAN:  Stable treated proliferative diabetic retinopathy of left eye determined by examination associated with type 2 diabetes mellitus (HCC) OS stable, no progression  Severe nonproliferative diabetic retinopathy of right eye ([place]) OD with moderate scatter PRP temporally and watershed regions.  No other progression in either quadrants.  We will continue to monitor  Left epiretinal membrane Minor OS we will continue to observe  Secondary optic atrophy, right Stable accounts for some vision.  OSA and COPD overlap syndrome ([place]) Compliant with CPAP     ICD-10-CM   1. Severe nonproliferative diabetic retinopathy of right eye without macular edema associated with type 2 diabetes mellitus (HCC)  I33.8250 Color Fundus Photography Optos - OU - Both Eyes    2. Stable treated proliferative diabetic retinopathy of left eye determined by examination associated with type 2 diabetes mellitus ([place])  N39.7673     3. Left epiretinal membrane  H35.372     4. Secondary optic atrophy, right  H47.291     5. OSA and COPD overlap syndrome (HCC)  G47.33    J44.9       1.  OD with severe NPDR we will continue to monitor and observe.  2.  OS with quiet PDR.  Stable  3.  Epiretinal membrane  left eye we will continue to monitor stable  Ophthalmic Meds Ordered this visit:  No orders of the defined types were placed in this encounter.      Return in about 6 months (around 04/23/2022) for DILATE OU, COLOR FP, possible PRP, OD.  There are no Patient Instructions on file for this visit.   Explained the diagnoses, plan, and follow up with the patient and they expressed understanding.  Patient expressed understanding of the importance of proper follow up care.   Clent Demark Lin Glazier M.D. Diseases & Surgery of the Retina and Vitreous Retina & Diabetic [place] 10/22/21     Abbreviations: M myopia (nearsighted); A astigmatism; H hyperopia (farsighted); P presbyopia; Mrx spectacle prescription;  CTL contact lenses; OD right eye; OS left eye; OU both eyes  XT exotropia; ET esotropia; PEK punctate epithelial keratitis; PEE punctate epithelial erosions; DES dry eye syndrome; MGD meibomian gland dysfunction; ATs artificial tears; PFAT's preservative free artificial tears; [place] nuclear sclerotic cataract; PSC posterior subcapsular cataract; ERM epi-retinal membrane; PVD posterior vitreous detachment; RD retinal detachment; DM diabetes mellitus; DR diabetic retinopathy; NPDR non-proliferative diabetic retinopathy; PDR proliferative diabetic retinopathy; CSME clinically significant macular edema; DME diabetic macular edema; dbh dot blot hemorrhages; CWS cotton wool spot; POAG primary open angle glaucoma; C/D cup-to-disc ratio; HVF humphrey visual field; GVF goldmann visual field; OCT optical coherence tomography; IOP intraocular pressure; BRVO Branch retinal vein occlusion; CRVO central retinal vein occlusion; CRAO  central retinal artery occlusion; BRAO branch retinal artery occlusion; RT retinal tear; SB scleral buckle; PPV pars plana vitrectomy; VH Vitreous hemorrhage; PRP panretinal laser photocoagulation; IVK intravitreal kenalog; VMT vitreomacular traction; MH Macular hole;  NVD neovascularization  of the disc; NVE neovascularization elsewhere; AREDS age related eye disease study; ARMD age related macular degeneration; POAG primary open angle glaucoma; EBMD epithelial/anterior basement membrane dystrophy; ACIOL anterior chamber intraocular lens; IOL intraocular lens; PCIOL posterior chamber intraocular lens; Phaco/IOL phacoemulsification with intraocular lens placement; [place] photorefractive keratectomy; LASIK laser assisted in situ keratomileusis; HTN hypertension; DM diabetes mellitus; COPD chronic obstructive pulmonary disease

## 2021-10-22 NOTE — Assessment & Plan Note (Signed)
OS stable, no progression

## 2021-10-30 ENCOUNTER — Encounter (INDEPENDENT_AMBULATORY_CARE_PROVIDER_SITE_OTHER): Payer: Medicare Other | Admitting: Ophthalmology

## 2021-12-22 ENCOUNTER — Telehealth: Payer: Self-pay

## 2021-12-22 NOTE — Telephone Encounter (Signed)
ATC pt and LVM regarding PAP re-enrollment for calendar year 2024. Informed pt that I would be placing renewal application in the mail and requested that they contact me directly if with any questions or concerns. Direct office number provided, will await f/u.  Provider portion will be placed in Dr. Kavin Leech box for signature. Placed page 4 and copies of pt and provider portion in Awaiting response folder.

## 2021-12-25 NOTE — Telephone Encounter (Signed)
Patient discontinued Nucala in September 2023. He is only on Dupixent at this time for atopic dermatitis. Further f/u will not be needed regarding this. Closing encounter.  Knox Saliva, PharmD, MPH, BCPS, CPP Clinical Pharmacist (Rheumatology and Pulmonology)

## 2022-01-21 ENCOUNTER — Other Ambulatory Visit: Payer: Self-pay | Admitting: Pulmonary Disease

## 2022-02-03 DIAGNOSIS — R609 Edema, unspecified: Secondary | ICD-10-CM | POA: Diagnosis not present

## 2022-02-03 DIAGNOSIS — J8283 Eosinophilic asthma: Secondary | ICD-10-CM | POA: Diagnosis not present

## 2022-02-03 DIAGNOSIS — R0602 Shortness of breath: Secondary | ICD-10-CM | POA: Diagnosis not present

## 2022-02-03 DIAGNOSIS — E1122 Type 2 diabetes mellitus with diabetic chronic kidney disease: Secondary | ICD-10-CM | POA: Diagnosis not present

## 2022-02-03 DIAGNOSIS — N184 Chronic kidney disease, stage 4 (severe): Secondary | ICD-10-CM | POA: Diagnosis not present

## 2022-02-10 DIAGNOSIS — I129 Hypertensive chronic kidney disease with stage 1 through stage 4 chronic kidney disease, or unspecified chronic kidney disease: Secondary | ICD-10-CM | POA: Diagnosis not present

## 2022-02-10 DIAGNOSIS — R609 Edema, unspecified: Secondary | ICD-10-CM | POA: Diagnosis not present

## 2022-02-10 DIAGNOSIS — N184 Chronic kidney disease, stage 4 (severe): Secondary | ICD-10-CM | POA: Diagnosis not present

## 2022-02-10 DIAGNOSIS — D649 Anemia, unspecified: Secondary | ICD-10-CM | POA: Diagnosis not present

## 2022-02-10 DIAGNOSIS — E1122 Type 2 diabetes mellitus with diabetic chronic kidney disease: Secondary | ICD-10-CM | POA: Diagnosis not present

## 2022-02-10 DIAGNOSIS — E875 Hyperkalemia: Secondary | ICD-10-CM | POA: Diagnosis not present

## 2022-02-11 DIAGNOSIS — I5033 Acute on chronic diastolic (congestive) heart failure: Secondary | ICD-10-CM | POA: Diagnosis not present

## 2022-02-11 DIAGNOSIS — I517 Cardiomegaly: Secondary | ICD-10-CM | POA: Diagnosis not present

## 2022-02-11 DIAGNOSIS — R0602 Shortness of breath: Secondary | ICD-10-CM | POA: Diagnosis not present

## 2022-02-11 DIAGNOSIS — E1122 Type 2 diabetes mellitus with diabetic chronic kidney disease: Secondary | ICD-10-CM | POA: Diagnosis not present

## 2022-02-11 DIAGNOSIS — E1140 Type 2 diabetes mellitus with diabetic neuropathy, unspecified: Secondary | ICD-10-CM | POA: Diagnosis not present

## 2022-02-11 DIAGNOSIS — I272 Pulmonary hypertension, unspecified: Secondary | ICD-10-CM | POA: Diagnosis not present

## 2022-02-11 DIAGNOSIS — N184 Chronic kidney disease, stage 4 (severe): Secondary | ICD-10-CM | POA: Diagnosis not present

## 2022-02-26 DIAGNOSIS — M6281 Muscle weakness (generalized): Secondary | ICD-10-CM | POA: Diagnosis not present

## 2022-02-26 DIAGNOSIS — R2689 Other abnormalities of gait and mobility: Secondary | ICD-10-CM | POA: Diagnosis not present

## 2022-03-02 DIAGNOSIS — N184 Chronic kidney disease, stage 4 (severe): Secondary | ICD-10-CM | POA: Diagnosis not present

## 2022-03-04 DIAGNOSIS — R2689 Other abnormalities of gait and mobility: Secondary | ICD-10-CM | POA: Diagnosis not present

## 2022-03-04 DIAGNOSIS — M6281 Muscle weakness (generalized): Secondary | ICD-10-CM | POA: Diagnosis not present

## 2022-03-12 DIAGNOSIS — M6281 Muscle weakness (generalized): Secondary | ICD-10-CM | POA: Diagnosis not present

## 2022-03-12 DIAGNOSIS — R2689 Other abnormalities of gait and mobility: Secondary | ICD-10-CM | POA: Diagnosis not present

## 2022-03-25 DIAGNOSIS — E7800 Pure hypercholesterolemia, unspecified: Secondary | ICD-10-CM | POA: Diagnosis not present

## 2022-03-25 DIAGNOSIS — G4733 Obstructive sleep apnea (adult) (pediatric): Secondary | ICD-10-CM | POA: Diagnosis not present

## 2022-03-25 DIAGNOSIS — E559 Vitamin D deficiency, unspecified: Secondary | ICD-10-CM | POA: Diagnosis not present

## 2022-03-25 DIAGNOSIS — E1122 Type 2 diabetes mellitus with diabetic chronic kidney disease: Secondary | ICD-10-CM | POA: Diagnosis not present

## 2022-03-25 DIAGNOSIS — I10 Essential (primary) hypertension: Secondary | ICD-10-CM | POA: Diagnosis not present

## 2022-03-25 DIAGNOSIS — J8283 Eosinophilic asthma: Secondary | ICD-10-CM | POA: Diagnosis not present

## 2022-03-25 DIAGNOSIS — E1140 Type 2 diabetes mellitus with diabetic neuropathy, unspecified: Secondary | ICD-10-CM | POA: Diagnosis not present

## 2022-03-25 DIAGNOSIS — N184 Chronic kidney disease, stage 4 (severe): Secondary | ICD-10-CM | POA: Diagnosis not present

## 2022-03-25 DIAGNOSIS — L300 Nummular dermatitis: Secondary | ICD-10-CM | POA: Diagnosis not present

## 2022-04-20 DIAGNOSIS — E113592 Type 2 diabetes mellitus with proliferative diabetic retinopathy without macular edema, left eye: Secondary | ICD-10-CM | POA: Diagnosis not present

## 2022-04-20 DIAGNOSIS — E113511 Type 2 diabetes mellitus with proliferative diabetic retinopathy with macular edema, right eye: Secondary | ICD-10-CM | POA: Diagnosis not present

## 2022-04-20 DIAGNOSIS — Z961 Presence of intraocular lens: Secondary | ICD-10-CM | POA: Diagnosis not present

## 2022-04-20 DIAGNOSIS — H31093 Other chorioretinal scars, bilateral: Secondary | ICD-10-CM | POA: Diagnosis not present

## 2022-04-20 DIAGNOSIS — H35372 Puckering of macula, left eye: Secondary | ICD-10-CM | POA: Diagnosis not present

## 2022-04-20 DIAGNOSIS — H3582 Retinal ischemia: Secondary | ICD-10-CM | POA: Diagnosis not present

## 2022-04-27 ENCOUNTER — Encounter (INDEPENDENT_AMBULATORY_CARE_PROVIDER_SITE_OTHER): Payer: Medicare Other | Admitting: Ophthalmology

## 2022-05-04 DIAGNOSIS — E113511 Type 2 diabetes mellitus with proliferative diabetic retinopathy with macular edema, right eye: Secondary | ICD-10-CM | POA: Diagnosis not present

## 2022-05-04 DIAGNOSIS — G4733 Obstructive sleep apnea (adult) (pediatric): Secondary | ICD-10-CM | POA: Diagnosis not present

## 2022-06-29 DIAGNOSIS — E1122 Type 2 diabetes mellitus with diabetic chronic kidney disease: Secondary | ICD-10-CM | POA: Diagnosis not present

## 2022-06-29 DIAGNOSIS — E7800 Pure hypercholesterolemia, unspecified: Secondary | ICD-10-CM | POA: Diagnosis not present

## 2022-06-29 DIAGNOSIS — E559 Vitamin D deficiency, unspecified: Secondary | ICD-10-CM | POA: Diagnosis not present

## 2022-06-29 DIAGNOSIS — I5032 Chronic diastolic (congestive) heart failure: Secondary | ICD-10-CM | POA: Diagnosis not present

## 2022-06-29 DIAGNOSIS — E1140 Type 2 diabetes mellitus with diabetic neuropathy, unspecified: Secondary | ICD-10-CM | POA: Diagnosis not present

## 2022-06-29 DIAGNOSIS — G4733 Obstructive sleep apnea (adult) (pediatric): Secondary | ICD-10-CM | POA: Diagnosis not present

## 2022-06-29 DIAGNOSIS — N184 Chronic kidney disease, stage 4 (severe): Secondary | ICD-10-CM | POA: Diagnosis not present

## 2022-06-29 DIAGNOSIS — I10 Essential (primary) hypertension: Secondary | ICD-10-CM | POA: Diagnosis not present

## 2022-06-30 DIAGNOSIS — L209 Atopic dermatitis, unspecified: Secondary | ICD-10-CM | POA: Diagnosis not present

## 2022-07-01 DIAGNOSIS — H04123 Dry eye syndrome of bilateral lacrimal glands: Secondary | ICD-10-CM | POA: Diagnosis not present

## 2022-07-01 DIAGNOSIS — H26492 Other secondary cataract, left eye: Secondary | ICD-10-CM | POA: Diagnosis not present

## 2022-07-01 DIAGNOSIS — H40013 Open angle with borderline findings, low risk, bilateral: Secondary | ICD-10-CM | POA: Diagnosis not present

## 2022-07-29 DIAGNOSIS — Z961 Presence of intraocular lens: Secondary | ICD-10-CM | POA: Diagnosis not present

## 2022-07-29 DIAGNOSIS — H40013 Open angle with borderline findings, low risk, bilateral: Secondary | ICD-10-CM | POA: Diagnosis not present

## 2022-07-29 DIAGNOSIS — H26492 Other secondary cataract, left eye: Secondary | ICD-10-CM | POA: Diagnosis not present

## 2022-07-30 DIAGNOSIS — E113592 Type 2 diabetes mellitus with proliferative diabetic retinopathy without macular edema, left eye: Secondary | ICD-10-CM | POA: Diagnosis not present

## 2022-07-30 DIAGNOSIS — E113511 Type 2 diabetes mellitus with proliferative diabetic retinopathy with macular edema, right eye: Secondary | ICD-10-CM | POA: Diagnosis not present

## 2022-07-30 DIAGNOSIS — Z961 Presence of intraocular lens: Secondary | ICD-10-CM | POA: Diagnosis not present

## 2022-07-30 DIAGNOSIS — H31093 Other chorioretinal scars, bilateral: Secondary | ICD-10-CM | POA: Diagnosis not present

## 2022-07-30 DIAGNOSIS — H3582 Retinal ischemia: Secondary | ICD-10-CM | POA: Diagnosis not present

## 2022-08-04 DIAGNOSIS — G4733 Obstructive sleep apnea (adult) (pediatric): Secondary | ICD-10-CM | POA: Diagnosis not present

## 2022-08-06 DIAGNOSIS — E875 Hyperkalemia: Secondary | ICD-10-CM | POA: Diagnosis not present

## 2022-08-06 DIAGNOSIS — D649 Anemia, unspecified: Secondary | ICD-10-CM | POA: Diagnosis not present

## 2022-08-06 DIAGNOSIS — N184 Chronic kidney disease, stage 4 (severe): Secondary | ICD-10-CM | POA: Diagnosis not present

## 2022-08-06 DIAGNOSIS — E1122 Type 2 diabetes mellitus with diabetic chronic kidney disease: Secondary | ICD-10-CM | POA: Diagnosis not present

## 2022-08-06 DIAGNOSIS — R609 Edema, unspecified: Secondary | ICD-10-CM | POA: Diagnosis not present

## 2022-08-06 DIAGNOSIS — I129 Hypertensive chronic kidney disease with stage 1 through stage 4 chronic kidney disease, or unspecified chronic kidney disease: Secondary | ICD-10-CM | POA: Diagnosis not present

## 2022-08-31 DIAGNOSIS — E113511 Type 2 diabetes mellitus with proliferative diabetic retinopathy with macular edema, right eye: Secondary | ICD-10-CM | POA: Diagnosis not present

## 2022-09-10 ENCOUNTER — Other Ambulatory Visit: Payer: Self-pay | Admitting: Pulmonary Disease

## 2022-09-11 ENCOUNTER — Telehealth: Payer: Self-pay | Admitting: Pulmonary Disease

## 2022-09-11 MED ORDER — BREZTRI AEROSPHERE 160-9-4.8 MCG/ACT IN AERO: 2.0000 | INHALATION_SPRAY | Freq: Two times a day (BID) | RESPIRATORY_TRACT | 6 refills | Status: DC

## 2022-09-11 NOTE — Telephone Encounter (Signed)
Patient needs a refill on medication Breztri inhaler. He is completely out.

## 2022-09-14 DIAGNOSIS — Z961 Presence of intraocular lens: Secondary | ICD-10-CM | POA: Diagnosis not present

## 2022-09-14 DIAGNOSIS — H3582 Retinal ischemia: Secondary | ICD-10-CM | POA: Diagnosis not present

## 2022-09-14 DIAGNOSIS — E113592 Type 2 diabetes mellitus with proliferative diabetic retinopathy without macular edema, left eye: Secondary | ICD-10-CM | POA: Diagnosis not present

## 2022-09-14 DIAGNOSIS — E113511 Type 2 diabetes mellitus with proliferative diabetic retinopathy with macular edema, right eye: Secondary | ICD-10-CM | POA: Diagnosis not present

## 2022-09-14 DIAGNOSIS — H35372 Puckering of macula, left eye: Secondary | ICD-10-CM | POA: Diagnosis not present

## 2022-09-14 DIAGNOSIS — H43391 Other vitreous opacities, right eye: Secondary | ICD-10-CM | POA: Diagnosis not present

## 2022-09-24 DIAGNOSIS — G4733 Obstructive sleep apnea (adult) (pediatric): Secondary | ICD-10-CM | POA: Diagnosis not present

## 2022-10-20 DIAGNOSIS — I5032 Chronic diastolic (congestive) heart failure: Secondary | ICD-10-CM | POA: Diagnosis not present

## 2022-10-20 DIAGNOSIS — Z139 Encounter for screening, unspecified: Secondary | ICD-10-CM | POA: Diagnosis not present

## 2022-10-20 DIAGNOSIS — Z23 Encounter for immunization: Secondary | ICD-10-CM | POA: Diagnosis not present

## 2022-10-20 DIAGNOSIS — E559 Vitamin D deficiency, unspecified: Secondary | ICD-10-CM | POA: Diagnosis not present

## 2022-10-20 DIAGNOSIS — E7800 Pure hypercholesterolemia, unspecified: Secondary | ICD-10-CM | POA: Diagnosis not present

## 2022-10-20 DIAGNOSIS — N184 Chronic kidney disease, stage 4 (severe): Secondary | ICD-10-CM | POA: Diagnosis not present

## 2022-10-20 DIAGNOSIS — E1140 Type 2 diabetes mellitus with diabetic neuropathy, unspecified: Secondary | ICD-10-CM | POA: Diagnosis not present

## 2022-10-20 DIAGNOSIS — I10 Essential (primary) hypertension: Secondary | ICD-10-CM | POA: Diagnosis not present

## 2022-10-20 DIAGNOSIS — Z9181 History of falling: Secondary | ICD-10-CM | POA: Diagnosis not present

## 2022-10-20 DIAGNOSIS — E1122 Type 2 diabetes mellitus with diabetic chronic kidney disease: Secondary | ICD-10-CM | POA: Diagnosis not present

## 2022-10-20 DIAGNOSIS — Z79899 Other long term (current) drug therapy: Secondary | ICD-10-CM | POA: Diagnosis not present

## 2022-11-09 ENCOUNTER — Ambulatory Visit: Payer: Medicare Other | Admitting: Pulmonary Disease

## 2022-11-09 ENCOUNTER — Encounter: Payer: Self-pay | Admitting: Pulmonary Disease

## 2022-11-09 VITALS — BP 134/58 | HR 75 | Temp 97.7°F | Ht 69.0 in | Wt 284.8 lb

## 2022-11-09 DIAGNOSIS — J449 Chronic obstructive pulmonary disease, unspecified: Secondary | ICD-10-CM

## 2022-11-09 DIAGNOSIS — G4733 Obstructive sleep apnea (adult) (pediatric): Secondary | ICD-10-CM

## 2022-11-09 MED ORDER — BREZTRI AEROSPHERE 160-9-4.8 MCG/ACT IN AERO: 2.0000 | INHALATION_SPRAY | Freq: Two times a day (BID) | RESPIRATORY_TRACT | 11 refills | Status: DC

## 2022-11-09 NOTE — Progress Notes (Signed)
Patient ID: Nathaniel Devenny., male    DOB: 01-31-1935, 87 y.o.   MRN: [id number]  Chief Complaint  Patient presents with   Follow-up    Doing well.    Referring provider: Lonie Peak, PA-C  HPI:  Nathaniel Gray is a 87 y.o. man with OSA, insulin-dependent diabetes, history of tobacco abuse (60-80-pack-year history) who resolve with recurrent wheeze and dyspnea on exertion.  Recurrent exacerbations requiring prednisone.  Further work-up demonstrated mixed restrictive (obesity)/obstructive physiology (COPD/asthma) with pseudonormalization of the FEV1/FVC ratio with moderate restriction on lung volumes with severely reduced ERV as well as evidence of gas trapping with elevated RV/TLC ratio.  Labs notable for elevated eosinophil count of 700.   11/09/2022  - Visit  Today he is feeling well.  Breathing stable.  Remains on Dupixent dual benefit for atopic dermatitis as well as eosinophilic asthma.  No exacerbations.  Breathing well.  Excellent adherence to Breztri 2 puff twice daily.  Rare albuterol use.   Questionaires / Pulmonary Flowsheets:   ACT:      No data to display          MMRC:     No data to display          Epworth:      No data to display          Tests:   FENO:  No results found for: "NITRICOXIDE"  PFT: Interpreted as mixed obstructive with pseudonormalization of FEV1 to FVC ratio as well as restrictive physiology given moderately decreased TLC, RV to TLC ratio of 133% predicted suggestive of gas trapping.  DLCO was moderately reduced.    Latest Ref Rng & Units 09/05/2019    4:03 PM  PFT Results  FVC-Pre L 2.46  P  FVC-Predicted Pre % 65  P  FVC-Post L 2.25  P  FVC-Predicted Post % 59  P  Pre FEV1/FVC % % 77  P  Post FEV1/FCV % % 80  P  FEV1-Pre L 1.90  P  FEV1-Predicted Pre % 71  P  FEV1-Post L 1.81  P  DLCO uncorrected ml/min/mmHg 15.32  P  DLCO UNC% % 65  P  DLCO corrected ml/min/mmHg 17.02  P  DLCO COR %Predicted % 72  P  DLVA Predicted  % 101  P  TLC L 5.02  P  TLC % Predicted % 73  P  RV % Predicted % 100  P    P Preliminary result    WALK:  Interpretation as follows: Patient ambulated 272 m.  He had to stop in the middle of the test due to back pain shortness of breath and eventually end of the walk early due to low back pain.  At the start of test, heart rate 69, BP 120/70, O2 sat 98 percent.  Oxygen nadir to 90% at end of test.  At end of test blood pressure 140/90, heart rate 94.  Upon resting, heart rate 82, blood pressure 130/84, oxygen saturation 97%.  Test demonstrates normal increase in heart rate and blood pressure but is abnormal with decrease in oxygen saturation to 90%.  No supplemental oxygen required during test.    09/13/2019    3:23 PM  SIX MIN WALK  Medications Breztri inhaler 160-9-4.49mcg and Metformin 1000mg  at 8:30  Supplimental Oxygen during Test? (L/min) No  Laps 8  Partial Lap (in Meters) 0 meters  Baseline BP (sitting) 120/70  Baseline Heartrate 69  Baseline Dyspnea (Borg Scale) 0  Baseline Fatigue (Borg  Scale) 0  Baseline SPO2 98 %  BP (sitting) 140/90  Heartrate 94  Dyspnea (Borg Scale) 4  Fatigue (Borg Scale) 1  SPO2 90 %  BP (sitting) 130/84  Heartrate 82  SPO2 97 %  Stopped or Paused before Six Minutes Yes  Other Symptoms at end of Exercise Patient had to pause with 27sec remaining for due to back pain. Patient then had to fully stop walk with 1 min 40 sec remaining due to back pain and SOB  Distance Completed 272 meters  Distance Completed 0 meters  Tech Comments: Pt walked at a moderate pace having complaints of back pain. Pt was not able to complete entire 6 minutes due to back pain and complaints of SOB.    Imaging: No results found.  Lab Results: Personally reviewed CBC    Component Value Date/Time   WBC 8.8 08/15/2019 1458   RBC 3.96 (L) 08/15/2019 1458   HGB 11.5 (L) 08/15/2019 1458   HCT 34.2 (L) 08/15/2019 1458   PLT 146.0 (L) 08/15/2019 1458    MCV 86.3 08/15/2019 1458   MCH 29.0 02/25/2015 1140   MCHC 33.5 08/15/2019 1458   RDW 14.5 08/15/2019 1458   LYMPHSABS 2.4 08/15/2019 1458   MONOABS 0.8 08/15/2019 1458   EOSABS 0.7 08/15/2019 1458   BASOSABS 0.1 08/15/2019 1458    BMET    Component Value Date/Time   NA 142 02/25/2015 1140   K 4.3 02/25/2015 1140   CL 102 02/25/2015 1140   CO2 27 02/25/2015 1140   GLUCOSE 162 (H) 02/25/2015 1140   BUN 20 02/25/2015 1140   CREATININE 1.15 02/25/2015 1140   CALCIUM 9.6 02/25/2015 1140   GFRNONAA 60 (L) 02/25/2015 1140   GFRAA >60 02/25/2015 1140    BNP No results found for: "BNP"  ProBNP No results found for: "PROBNP"  Specialty Problems       Pulmonary Problems   OSA and COPD overlap syndrome (HCC)    Patient on CPAP use roughly 2 years       Allergies  Allergen Reactions   Beef-Derived Products Anaphylaxis   Lactose Intolerance (Gi) Anaphylaxis and Diarrhea    Gas   Pork-Derived Products Anaphylaxis    Immunization History  Administered Date(s) Administered   Influenza, High Dose Seasonal PF 11/07/2018   Moderna Sars-Covid-2 Vaccination 03/07/2019, 04/04/2019   Zoster Recombinant(Shingrix) 07/01/2017    Past Medical History:  Diagnosis Date   Anxiety    BPH (benign prostatic hyperplasia)    Cancer (HCC)    prostate --  borderline...only takes meds   psa was elevated   Depression    Food allergy    Allergy to alpha-gal   High cholesterol    HOH (hard of hearing)    Hypertension    Pneumonia    Sleep apnea     Tobacco History: Social History   Tobacco Use  Smoking Status Former   Current packs/day: 0.00   Average packs/day: 1.5 packs/day for 40.0 years (60.0 ttl pk-yrs)   Types: Cigarettes   Start date: 27   Quit date: 01/19/1989   Years since quitting: 33.8  Smokeless Tobacco Never   Counseling given: Not Answered   Continue to not smoke  Outpatient Encounter Medications as of 11/09/2022  Medication Sig   acetaminophen  (TYLENOL) 500 MG tablet Take 1,000 mg by mouth daily as needed for mild pain.   albuterol (VENTOLIN HFA) 108 (90 Base) MCG/ACT inhaler SMARTSIG:2 Puff(s) By Mouth Every 4-6 Hours PRN  ALPRAZolam (XANAX) 0.5 MG tablet Take 0.5 mg by mouth daily as needed for anxiety.   aspirin EC 81 MG tablet Take 81 mg by mouth daily.   buPROPion (WELLBUTRIN XL) 300 MG 24 hr tablet Take 300 mg by mouth daily.   cetirizine (ZYRTEC) 10 MG tablet Take 20 mg by mouth daily.   cyanocobalamin 1000 MCG tablet Take 1,000 mcg by mouth daily.    Dupilumab (DUPIXENT [place]) Inject into the skin.   EPINEPHrine (EPIPEN 2-PAK) 0.3 mg/0.3 mL IJ SOAJ injection Inject 0.3 mLs (0.3 mg total) into the muscle as needed for anaphylaxis.   escitalopram (LEXAPRO) 10 MG tablet TAKE 1 TABLET BY MOUTH ONCE DAILY FOR ANXIETY   finasteride (PROSCAR) 5 MG tablet Take 5 mg by mouth daily.   fluocinonide (LIDEX) 0.05 % external solution APPLY 1 ML TOPICALLY TWICE DAILY   furosemide (LASIX) 40 MG tablet Take 80 mg by mouth daily.   insulin glargine (LANTUS) 100 UNIT/ML injection Inject 74 Units into the skin 2 (two) times daily.   losartan (COZAAR) 100 MG tablet Take 100 mg by mouth daily.   lovastatin (MEVACOR) 20 MG tablet    montelukast (SINGULAIR) 10 MG tablet Take 1 tablet (10 mg total) by mouth at bedtime.   Omega-3 Fatty Acids (FISH OIL) 1200 MG CAPS Take 1,200 mg by mouth 2 (two) times daily.   OZEMPIC, 1 MG/DOSE, 4 MG/3ML SOPN Inject 1 mg into the skin once a week.   tamsulosin (FLOMAX) 0.4 MG CAPS capsule Take 0.4 mg by mouth daily.   triamcinolone cream (KENALOG) 0.1 % APPLY CREAM TO RASH TWICE DAILY   Turmeric (QC TUMERIC COMPLEX PO) Take by mouth.   [DISCONTINUED] Budeson-Glycopyrrol-Formoterol (BREZTRI AEROSPHERE) 160-9-4.8 MCG/ACT AERO Inhale 2 puffs into the lungs 2 (two) times daily.   Budeson-Glycopyrrol-Formoterol (BREZTRI AEROSPHERE) 160-9-4.8 MCG/ACT AERO Inhale 2 puffs into the lungs 2 (two) times daily.   [DISCONTINUED]  BREZTRI AEROSPHERE 160-9-4.8 MCG/ACT AERO INHALE 2 PUFFS TWICE DAILY (Patient not taking: Reported on 11/09/2022)   [DISCONTINUED] hydrochlorothiazide (HYDRODIURIL) 25 MG tablet Take 25 mg by mouth daily. (Patient not taking: Reported on 11/09/2022)   [DISCONTINUED] metFORMIN (GLUCOPHAGE) 500 MG tablet Take 500 mg by mouth 2 (two) times daily with a meal. (Patient not taking: Reported on 11/09/2022)   No facility-administered encounter medications on file as of 11/09/2022.     Review of Systems  Review of Systems  N/A Physical Exam  BP (!) 134/58 (BP Location: Right Arm, Patient Position: Sitting, Cuff Size: Large)   Pulse 75   Temp 97.7 F (36.5 C) (Oral)   Ht 5\' 9"  (1.753 m)   Wt 284 lb 12.8 oz (129.2 kg)   SpO2 95%   BMI 42.06 kg/m   Wt Readings from Last 5 Encounters:  11/09/22 284 lb 12.8 oz (129.2 kg)  09/29/21 297 lb 9.6 oz (135 kg)  02/27/21 298 lb (135.2 kg)  08/13/20 (!) 300 lb 9.6 oz (136.4 kg)  12/08/19 293 lb 6 oz (133.1 kg)    BMI Readings from Last 5 Encounters:  11/09/22 42.06 kg/m  09/29/21 43.95 kg/m  02/27/21 44.01 kg/m  08/13/20 44.39 kg/m  12/08/19 43.32 kg/m     Physical Exam General: Obese, no acute distress Respiratory:clear to auscultation, good air movement Abdomen: soft, bowel sounds present Neuro: Normal gait, no weakness   Assessment & Plan:    Nathaniel Gray is a 87 y.o. man with OSA, insulin-dependent diabetes, history of tobacco abuse (60-80-pack-year history) who resolve with recurrent  wheeze and dyspnea on exertion.  Recurrent exacerbations requiring prednisone.  Further work-up demonstrated mixed restrictive (obesity)/obstructive physiology (COPD/asthma) with pseudonormalization of the FEV1/FVC ratio with moderate restriction on lung volumes with severely reduced ERV as well as evidence of gas trapping with elevated RV/TLC ratio.  Labs notable for elevated eosinophil count of 700.  Feel his symptoms reflect the combination of  restrictive ventilatory defect in the setting of obesity but in large part due to likely underlying COPD in the setting of tobacco abuse and undiagnosed asthma with eosinophilic phenotype.  Suspect cough related to underlying asthma/COPD.  Improved  on triple inhaled therapy, Breztri. Given recurrent exacerbations and eosinophilic phenotype, started nucala 10/13/19. Continues on tripled inhaled therapy Nathaniel Gray). Breathing remains stable, improved.  No exacerbations.  Started on Dupixent for atopic dermatitis.  No need for durable coverage, Nucala discontinued 09/2021.  With ongoing well-controlled disease, asthma.  Breztri refilled today.  For his OSA, he continues to report good adherence to CPAP therapy.  He is encouraged to continue this.   Return in about 1 year (around 11/09/2023).   Karren Burly, MD 11/09/2022

## 2022-11-09 NOTE — Patient Instructions (Addendum)
Nice to see you again   No changes to medications  Return to clinic in 1 year or sooner as needed

## 2022-12-18 DIAGNOSIS — E119 Type 2 diabetes mellitus without complications: Secondary | ICD-10-CM | POA: Diagnosis not present

## 2022-12-18 DIAGNOSIS — Z20822 Contact with and (suspected) exposure to covid-19: Secondary | ICD-10-CM | POA: Diagnosis not present

## 2022-12-18 DIAGNOSIS — J45909 Unspecified asthma, uncomplicated: Secondary | ICD-10-CM | POA: Diagnosis not present

## 2022-12-18 DIAGNOSIS — I10 Essential (primary) hypertension: Secondary | ICD-10-CM | POA: Diagnosis not present

## 2022-12-18 DIAGNOSIS — J209 Acute bronchitis, unspecified: Secondary | ICD-10-CM | POA: Diagnosis not present

## 2022-12-24 DIAGNOSIS — G4733 Obstructive sleep apnea (adult) (pediatric): Secondary | ICD-10-CM | POA: Diagnosis not present

## 2022-12-29 DIAGNOSIS — E875 Hyperkalemia: Secondary | ICD-10-CM | POA: Diagnosis not present

## 2022-12-29 DIAGNOSIS — N184 Chronic kidney disease, stage 4 (severe): Secondary | ICD-10-CM | POA: Diagnosis not present

## 2022-12-29 DIAGNOSIS — I129 Hypertensive chronic kidney disease with stage 1 through stage 4 chronic kidney disease, or unspecified chronic kidney disease: Secondary | ICD-10-CM | POA: Diagnosis not present

## 2022-12-29 DIAGNOSIS — D649 Anemia, unspecified: Secondary | ICD-10-CM | POA: Diagnosis not present

## 2022-12-29 DIAGNOSIS — E1122 Type 2 diabetes mellitus with diabetic chronic kidney disease: Secondary | ICD-10-CM | POA: Diagnosis not present

## 2022-12-29 DIAGNOSIS — R609 Edema, unspecified: Secondary | ICD-10-CM | POA: Diagnosis not present

## 2023-01-27 DIAGNOSIS — G4733 Obstructive sleep apnea (adult) (pediatric): Secondary | ICD-10-CM | POA: Diagnosis not present

## 2023-01-27 DIAGNOSIS — E7800 Pure hypercholesterolemia, unspecified: Secondary | ICD-10-CM | POA: Diagnosis not present

## 2023-01-27 DIAGNOSIS — I5032 Chronic diastolic (congestive) heart failure: Secondary | ICD-10-CM | POA: Diagnosis not present

## 2023-01-27 DIAGNOSIS — E1122 Type 2 diabetes mellitus with diabetic chronic kidney disease: Secondary | ICD-10-CM | POA: Diagnosis not present

## 2023-01-27 DIAGNOSIS — N184 Chronic kidney disease, stage 4 (severe): Secondary | ICD-10-CM | POA: Diagnosis not present

## 2023-01-27 DIAGNOSIS — I10 Essential (primary) hypertension: Secondary | ICD-10-CM | POA: Diagnosis not present

## 2023-01-27 DIAGNOSIS — E1140 Type 2 diabetes mellitus with diabetic neuropathy, unspecified: Secondary | ICD-10-CM | POA: Diagnosis not present

## 2023-03-01 DIAGNOSIS — H401132 Primary open-angle glaucoma, bilateral, moderate stage: Secondary | ICD-10-CM | POA: Diagnosis not present

## 2023-03-01 DIAGNOSIS — H59813 Chorioretinal scars after surgery for detachment, bilateral: Secondary | ICD-10-CM | POA: Diagnosis not present

## 2023-03-01 DIAGNOSIS — H3582 Retinal ischemia: Secondary | ICD-10-CM | POA: Diagnosis not present

## 2023-03-01 DIAGNOSIS — E113513 Type 2 diabetes mellitus with proliferative diabetic retinopathy with macular edema, bilateral: Secondary | ICD-10-CM | POA: Diagnosis not present

## 2023-03-01 DIAGNOSIS — H35372 Puckering of macula, left eye: Secondary | ICD-10-CM | POA: Diagnosis not present

## 2023-03-17 DIAGNOSIS — L209 Atopic dermatitis, unspecified: Secondary | ICD-10-CM | POA: Diagnosis not present

## 2023-03-17 DIAGNOSIS — D485 Neoplasm of uncertain behavior of skin: Secondary | ICD-10-CM | POA: Diagnosis not present

## 2023-03-17 DIAGNOSIS — L219 Seborrheic dermatitis, unspecified: Secondary | ICD-10-CM | POA: Diagnosis not present

## 2023-03-24 DIAGNOSIS — R600 Localized edema: Secondary | ICD-10-CM | POA: Diagnosis not present

## 2023-03-24 DIAGNOSIS — L03115 Cellulitis of right lower limb: Secondary | ICD-10-CM | POA: Diagnosis not present

## 2023-04-29 DIAGNOSIS — I129 Hypertensive chronic kidney disease with stage 1 through stage 4 chronic kidney disease, or unspecified chronic kidney disease: Secondary | ICD-10-CM | POA: Diagnosis not present

## 2023-04-29 DIAGNOSIS — E875 Hyperkalemia: Secondary | ICD-10-CM | POA: Diagnosis not present

## 2023-04-29 DIAGNOSIS — E1129 Type 2 diabetes mellitus with other diabetic kidney complication: Secondary | ICD-10-CM | POA: Diagnosis not present

## 2023-04-29 DIAGNOSIS — E1122 Type 2 diabetes mellitus with diabetic chronic kidney disease: Secondary | ICD-10-CM | POA: Diagnosis not present

## 2023-04-29 DIAGNOSIS — R609 Edema, unspecified: Secondary | ICD-10-CM | POA: Diagnosis not present

## 2023-04-29 DIAGNOSIS — D649 Anemia, unspecified: Secondary | ICD-10-CM | POA: Diagnosis not present

## 2023-04-29 DIAGNOSIS — N184 Chronic kidney disease, stage 4 (severe): Secondary | ICD-10-CM | POA: Diagnosis not present

## 2023-05-04 DIAGNOSIS — N184 Chronic kidney disease, stage 4 (severe): Secondary | ICD-10-CM | POA: Diagnosis not present

## 2023-05-04 DIAGNOSIS — I5032 Chronic diastolic (congestive) heart failure: Secondary | ICD-10-CM | POA: Diagnosis not present

## 2023-05-04 DIAGNOSIS — I10 Essential (primary) hypertension: Secondary | ICD-10-CM | POA: Diagnosis not present

## 2023-05-04 DIAGNOSIS — E1122 Type 2 diabetes mellitus with diabetic chronic kidney disease: Secondary | ICD-10-CM | POA: Diagnosis not present

## 2023-05-04 DIAGNOSIS — E1140 Type 2 diabetes mellitus with diabetic neuropathy, unspecified: Secondary | ICD-10-CM | POA: Diagnosis not present

## 2023-05-04 DIAGNOSIS — E7800 Pure hypercholesterolemia, unspecified: Secondary | ICD-10-CM | POA: Diagnosis not present

## 2023-05-04 DIAGNOSIS — E875 Hyperkalemia: Secondary | ICD-10-CM | POA: Diagnosis not present

## 2023-05-04 DIAGNOSIS — Z23 Encounter for immunization: Secondary | ICD-10-CM | POA: Diagnosis not present

## 2023-05-04 DIAGNOSIS — G4733 Obstructive sleep apnea (adult) (pediatric): Secondary | ICD-10-CM | POA: Diagnosis not present

## 2023-08-11 DIAGNOSIS — L03115 Cellulitis of right lower limb: Secondary | ICD-10-CM | POA: Diagnosis not present

## 2023-08-16 DIAGNOSIS — I5032 Chronic diastolic (congestive) heart failure: Secondary | ICD-10-CM | POA: Diagnosis not present

## 2023-08-16 DIAGNOSIS — I10 Essential (primary) hypertension: Secondary | ICD-10-CM | POA: Diagnosis not present

## 2023-08-16 DIAGNOSIS — N184 Chronic kidney disease, stage 4 (severe): Secondary | ICD-10-CM | POA: Diagnosis not present

## 2023-08-16 DIAGNOSIS — L03115 Cellulitis of right lower limb: Secondary | ICD-10-CM | POA: Diagnosis not present

## 2023-08-16 DIAGNOSIS — E7800 Pure hypercholesterolemia, unspecified: Secondary | ICD-10-CM | POA: Diagnosis not present

## 2023-08-16 DIAGNOSIS — E1140 Type 2 diabetes mellitus with diabetic neuropathy, unspecified: Secondary | ICD-10-CM | POA: Diagnosis not present

## 2023-08-16 DIAGNOSIS — E1122 Type 2 diabetes mellitus with diabetic chronic kidney disease: Secondary | ICD-10-CM | POA: Diagnosis not present

## 2023-08-16 DIAGNOSIS — G4733 Obstructive sleep apnea (adult) (pediatric): Secondary | ICD-10-CM | POA: Diagnosis not present

## 2023-08-30 DIAGNOSIS — H3582 Retinal ischemia: Secondary | ICD-10-CM | POA: Diagnosis not present

## 2023-08-30 DIAGNOSIS — H59813 Chorioretinal scars after surgery for detachment, bilateral: Secondary | ICD-10-CM | POA: Diagnosis not present

## 2023-08-30 DIAGNOSIS — H35372 Puckering of macula, left eye: Secondary | ICD-10-CM | POA: Diagnosis not present

## 2023-08-30 DIAGNOSIS — E113513 Type 2 diabetes mellitus with proliferative diabetic retinopathy with macular edema, bilateral: Secondary | ICD-10-CM | POA: Diagnosis not present

## 2023-08-30 DIAGNOSIS — H401132 Primary open-angle glaucoma, bilateral, moderate stage: Secondary | ICD-10-CM | POA: Diagnosis not present

## 2023-09-27 DIAGNOSIS — N184 Chronic kidney disease, stage 4 (severe): Secondary | ICD-10-CM | POA: Diagnosis not present

## 2023-10-05 DIAGNOSIS — L209 Atopic dermatitis, unspecified: Secondary | ICD-10-CM | POA: Diagnosis not present

## 2023-10-28 ENCOUNTER — Ambulatory Visit: Admitting: Pulmonary Disease

## 2023-10-28 ENCOUNTER — Encounter: Payer: Self-pay | Admitting: Pulmonary Disease

## 2023-10-28 VITALS — BP 167/80 | HR 55 | Temp 98.0°F | Ht 69.0 in | Wt 284.4 lb

## 2023-10-28 DIAGNOSIS — G4733 Obstructive sleep apnea (adult) (pediatric): Secondary | ICD-10-CM

## 2023-10-28 DIAGNOSIS — J449 Chronic obstructive pulmonary disease, unspecified: Secondary | ICD-10-CM | POA: Diagnosis not present

## 2023-10-28 DIAGNOSIS — F17210 Nicotine dependence, cigarettes, uncomplicated: Secondary | ICD-10-CM | POA: Diagnosis not present

## 2023-10-28 MED ORDER — BREZTRI AEROSPHERE 160-9-4.8 MCG/ACT IN AERO: 2.0000 | INHALATION_SPRAY | Freq: Two times a day (BID) | RESPIRATORY_TRACT | 11 refills | Status: AC

## 2023-10-28 NOTE — Progress Notes (Signed)
 Patient ID: Nathaniel Gray., male    DOB: [birth year], 88 y.o.   MRN: [id number]  No chief complaint on file.   Referring provider: Montey Lot, PA-C  HPI:  Nathaniel Gray is a 88 y.o. man with OSA, insulin -dependent diabetes, history of tobacco abuse (60-80-pack-year history) who resolve with recurrent wheeze and dyspnea on exertion.  Recurrent exacerbations requiring prednisone .  Further work-up demonstrated mixed restrictive (obesity)/obstructive physiology (COPD/asthma) with pseudonormalization of the FEV1/FVC ratio with moderate restriction on lung volumes with severely reduced ERV as well as evidence of gas trapping with elevated RV/TLC ratio.  Labs notable for elevated eosinophil count of 700.   10/28/2023  - Visit  Today he is feeling well.  Breathing stable.  Remains on Dupixent dual benefit for atopic dermatitis as well as eosinophilic asthma.  Overall okay.  Fortunately contracted a viral illness few weeks ago.  Still a bit of congestion and cough.  Slowly getting better.   Questionaires / Pulmonary Flowsheets:   ACT:      No data to display          MMRC:     No data to display          Epworth:      No data to display          Tests:   FENO:  No results found for: NITRICOXIDE  PFT: Interpreted as mixed obstructive with pseudonormalization of FEV1 to FVC ratio as well as restrictive physiology given moderately decreased TLC, RV to TLC ratio of 133% predicted suggestive of gas trapping.  DLCO was moderately reduced.    Latest Ref Rng & Units 09/05/2019    4:03 PM  PFT Results  FVC-Pre L 2.46  P  FVC-Predicted Pre % 65  P  FVC-Post L 2.25  P  FVC-Predicted Post % 59  P  Pre FEV1/FVC % % 77  P  Post FEV1/FCV % % 80  P  FEV1-Pre L 1.90  P  FEV1-Predicted Pre % 71  P  FEV1-Post L 1.81  P  DLCO uncorrected ml/min/mmHg 15.32  P  DLCO UNC% % 65  P  DLCO corrected ml/min/mmHg 17.02  P  DLCO COR %Predicted % 72  P  DLVA Predicted % 101  P  TLC L  5.02  P  TLC % Predicted % 73  P  RV % Predicted % 100  P    P Preliminary result    WALK:  Interpretation as follows: Patient ambulated 272 m.  He had to stop in the middle of the test due to back pain shortness of breath and eventually end of the walk early due to low back pain.  At the start of test, heart rate 69, BP 120/70, O2 sat 98 percent.  Oxygen nadir to 90% at end of test.  At end of test blood pressure 140/90, heart rate 94.  Upon resting, heart rate 82, blood pressure 130/84, oxygen saturation 97%.  Test demonstrates normal increase in heart rate and blood pressure but is abnormal with decrease in oxygen saturation to 90%.  No supplemental oxygen required during test.    09/13/2019    3:23 PM  SIX MIN WALK  Medications Breztri  inhaler 160-9-4.91mcg and Metformin  1000mg  at 8:30  Supplimental Oxygen during Test? (L/min) No  Laps 8  Partial Lap (in Meters) 0 meters  Baseline BP (sitting) 120/70  Baseline Heartrate 69  Baseline Dyspnea (Borg Scale) 0  Baseline Fatigue (Borg Scale) 0  Baseline SPO2  98 %  BP (sitting) 140/90  Heartrate 94  Dyspnea (Borg Scale) 4  Fatigue (Borg Scale) 1  SPO2 90 %  BP (sitting) 130/84  Heartrate 82  SPO2 97 %  Stopped or Paused before Six Minutes Yes  Other Symptoms at end of Exercise Patient had to pause with 27sec remaining for due to back pain. Patient then had to fully stop walk with 1 min 40 sec remaining due to back pain and SOB  Distance Completed 272 meters  Distance Completed 0 meters  Tech Comments: Pt walked at a moderate pace having complaints of back pain. Pt was not able to complete entire 6 minutes due to back pain and complaints of SOB.    Imaging: No results found.  Lab Results: Personally reviewed CBC    Component Value Date/Time   WBC 8.8 08/15/2019 1458   RBC 3.96 (L) 08/15/2019 1458   HGB 11.5 (L) 08/15/2019 1458   HCT 34.2 (L) 08/15/2019 1458   PLT 146.0 (L) 08/15/2019 1458   MCV 86.3  08/15/2019 1458   MCH 29.0 02/25/2015 1140   MCHC 33.5 08/15/2019 1458   RDW 14.5 08/15/2019 1458   LYMPHSABS 2.4 08/15/2019 1458   MONOABS 0.8 08/15/2019 1458   EOSABS 0.7 08/15/2019 1458   BASOSABS 0.1 08/15/2019 1458    BMET    Component Value Date/Time   NA 142 02/25/2015 1140   K 4.3 02/25/2015 1140   CL 102 02/25/2015 1140   CO2 27 02/25/2015 1140   GLUCOSE 162 (H) 02/25/2015 1140   BUN 20 02/25/2015 1140   CREATININE 1.15 02/25/2015 1140   CALCIUM 9.6 02/25/2015 1140   GFRNONAA 60 (L) 02/25/2015 1140   GFRAA >60 02/25/2015 1140    BNP No results found for: BNP  ProBNP No results found for: PROBNP  Specialty Problems       Pulmonary Problems   OSA and COPD overlap syndrome (HCC)   Patient on CPAP use roughly 2 years       Allergies  Allergen Reactions   Bovine (Beef) Protein-Containing Drug Products Anaphylaxis   Lactose Intolerance (Gi) Anaphylaxis and Diarrhea    Gas   Porcine (Pork) Protein-Containing Drug Products Anaphylaxis    Immunization History  Administered Date(s) Administered   INFLUENZA, HIGH DOSE SEASONAL PF 11/07/2018   Moderna Sars-Covid-2 Vaccination 03/07/2019, 04/04/2019   Zoster Recombinant(Shingrix) 07/01/2017    Past Medical History:  Diagnosis Date   Anxiety    BPH (benign prostatic hyperplasia)    Cancer (HCC)    prostate --  borderline...only takes meds   psa was elevated   Depression    Food allergy    Allergy to alpha-gal   High cholesterol    HOH (hard of hearing)    Hypertension    Pneumonia    Sleep apnea     Tobacco History: Social History   Tobacco Use  Smoking Status Former   Current packs/day: 0.00   Average packs/day: 1.5 packs/day for 40.0 years (60.0 ttl pk-yrs)   Types: Cigarettes   Start date: 53   Quit date: 01/19/1989   Years since quitting: 34.7  Smokeless Tobacco Never   Counseling given: Not Answered   Continue to not smoke  Outpatient Encounter Medications as of 10/28/2023   Medication Sig   acetaminophen  (TYLENOL ) 500 MG tablet Take 1,000 mg by mouth daily as needed for mild pain.   albuterol  (VENTOLIN  HFA) 108 (90 Base) MCG/ACT inhaler SMARTSIG:2 Puff(s) By Mouth Every 4-6 Hours PRN  ALPRAZolam  (XANAX ) 0.5 MG tablet Take 0.5 mg by mouth daily as needed for anxiety.   aspirin EC 81 MG tablet Take 81 mg by mouth daily.   buPROPion  (WELLBUTRIN  XL) 300 MG 24 hr tablet Take 300 mg by mouth daily.   cetirizine (ZYRTEC) 10 MG tablet Take 20 mg by mouth daily.   cyanocobalamin 1000 MCG tablet Take 1,000 mcg by mouth daily.    Dupilumab (DUPIXENT [place]) Inject into the skin.   EPINEPHrine  (EPIPEN  2-PAK) 0.3 mg/0.3 mL IJ SOAJ injection Inject 0.3 mLs (0.3 mg total) into the muscle as needed for anaphylaxis.   escitalopram (LEXAPRO) 10 MG tablet TAKE 1 TABLET BY MOUTH ONCE DAILY FOR ANXIETY   finasteride  (PROSCAR ) 5 MG tablet Take 5 mg by mouth daily.   fluocinonide (LIDEX) 0.05 % external solution APPLY 1 ML TOPICALLY TWICE DAILY   furosemide (LASIX) 40 MG tablet Take 80 mg by mouth daily.   insulin  glargine (LANTUS ) 100 UNIT/ML injection Inject 74 Units into the skin 2 (two) times daily.   losartan  (COZAAR ) 100 MG tablet Take 100 mg by mouth daily.   lovastatin (MEVACOR) 20 MG tablet    montelukast  (SINGULAIR ) 10 MG tablet Take 1 tablet (10 mg total) by mouth at bedtime.   Omega-3 Fatty Acids (FISH OIL) 1200 MG CAPS Take 1,200 mg by mouth 2 (two) times daily.   OZEMPIC, 1 MG/DOSE, 4 MG/3ML SOPN Inject 1 mg into the skin once a week.   tamsulosin  (FLOMAX ) 0.4 MG CAPS capsule Take 0.4 mg by mouth daily.   triamcinolone  cream (KENALOG ) 0.1 % APPLY CREAM TO RASH TWICE DAILY   Turmeric (QC TUMERIC COMPLEX PO) Take by mouth.   [DISCONTINUED] Budeson-Glycopyrrol-Formoterol (BREZTRI  AEROSPHERE) 160-9-4.8 MCG/ACT AERO Inhale 2 puffs into the lungs 2 (two) times daily.   budesonide-glycopyrrolate -formoterol (BREZTRI  AEROSPHERE) 160-9-4.8 MCG/ACT AERO inhaler Inhale 2 puffs into  the lungs 2 (two) times daily.   No facility-administered encounter medications on file as of 10/28/2023.     Review of Systems  Review of Systems  N/A Physical Exam  BP (!) 167/80   Pulse (!) 55   Temp 98 F (36.7 C) (Oral)   Ht 5' 9 (1.753 m)   Wt 284 lb 6.4 oz (129 kg)   SpO2 98%   BMI 42.00 kg/m   Wt Readings from Last 5 Encounters:  10/28/23 284 lb 6.4 oz (129 kg)  11/09/22 284 lb 12.8 oz (129.2 kg)  09/29/21 297 lb 9.6 oz (135 kg)  02/27/21 298 lb (135.2 kg)  08/13/20 (!) 300 lb 9.6 oz (136.4 kg)    BMI Readings from Last 5 Encounters:  10/28/23 42.00 kg/m  11/09/22 42.06 kg/m  09/29/21 43.95 kg/m  02/27/21 44.01 kg/m  08/13/20 44.39 kg/m     Physical Exam General: Obese, no acute distress Respiratory:clear to auscultation, good air movement Abdomen: soft, bowel sounds present Neuro: Normal gait, no weakness   Assessment & Plan:    Mr. Enriques is a 88 y.o. man with OSA, insulin -dependent diabetes, history of tobacco abuse (60-80-pack-year history) who resolve with recurrent wheeze and dyspnea on exertion.  Recurrent exacerbations requiring prednisone .  Further work-up demonstrated mixed restrictive (obesity)/obstructive physiology (COPD/asthma) with pseudonormalization of the FEV1/FVC ratio with moderate restriction on lung volumes with severely reduced ERV as well as evidence of gas trapping with elevated RV/TLC ratio.  Labs notable for elevated eosinophil count of 700.  Feel his symptoms reflect the combination of restrictive ventilatory defect in the setting of obesity but in  large part due to likely underlying COPD in the setting of tobacco abuse and undiagnosed asthma with eosinophilic phenotype.  Suspect cough related to underlying asthma/COPD.  Improved  on triple inhaled therapy, Breztri . Given recurrent exacerbations and eosinophilic phenotype, started nucala  10/13/19. Continues on tripled inhaled therapy (Breztri ). Breathing remains stable,  improved.  No exacerbations.  Started on Dupixent for atopic dermatitis.  No need for durable coverage, Nucala  discontinued 09/2021.  With ongoing well-controlled disease, asthma.  Breztri  refilled today.  Small supply of prednisone  to have on hand given recent viral illness and ongoing congestion.  Will allow more time to see if it improves to minimize hyperglycemia that occurs with steroid administration.  For his OSA, he continues to report good adherence to CPAP therapy.  He is encouraged to continue this.   Return in about 1 year (around 10/27/2024).   Donnice JONELLE Beals, MD 10/28/2023

## 2023-11-17 DIAGNOSIS — G4733 Obstructive sleep apnea (adult) (pediatric): Secondary | ICD-10-CM | POA: Diagnosis not present

## 2023-11-17 DIAGNOSIS — Z23 Encounter for immunization: Secondary | ICD-10-CM | POA: Diagnosis not present

## 2023-11-17 DIAGNOSIS — Z9181 History of falling: Secondary | ICD-10-CM | POA: Diagnosis not present

## 2023-11-17 DIAGNOSIS — E7800 Pure hypercholesterolemia, unspecified: Secondary | ICD-10-CM | POA: Diagnosis not present

## 2023-11-17 DIAGNOSIS — I10 Essential (primary) hypertension: Secondary | ICD-10-CM | POA: Diagnosis not present

## 2023-11-17 DIAGNOSIS — Z139 Encounter for screening, unspecified: Secondary | ICD-10-CM | POA: Diagnosis not present

## 2023-11-17 DIAGNOSIS — I5032 Chronic diastolic (congestive) heart failure: Secondary | ICD-10-CM | POA: Diagnosis not present

## 2023-11-17 DIAGNOSIS — E1122 Type 2 diabetes mellitus with diabetic chronic kidney disease: Secondary | ICD-10-CM | POA: Diagnosis not present

## 2023-11-17 DIAGNOSIS — N184 Chronic kidney disease, stage 4 (severe): Secondary | ICD-10-CM | POA: Diagnosis not present

## 2023-11-17 DIAGNOSIS — E1140 Type 2 diabetes mellitus with diabetic neuropathy, unspecified: Secondary | ICD-10-CM | POA: Diagnosis not present

## 2023-11-18 DIAGNOSIS — N184 Chronic kidney disease, stage 4 (severe): Secondary | ICD-10-CM | POA: Diagnosis not present

## 2023-11-18 DIAGNOSIS — R609 Edema, unspecified: Secondary | ICD-10-CM | POA: Diagnosis not present

## 2023-11-18 DIAGNOSIS — E875 Hyperkalemia: Secondary | ICD-10-CM | POA: Diagnosis not present

## 2023-11-18 DIAGNOSIS — D649 Anemia, unspecified: Secondary | ICD-10-CM | POA: Diagnosis not present

## 2023-11-18 DIAGNOSIS — E1122 Type 2 diabetes mellitus with diabetic chronic kidney disease: Secondary | ICD-10-CM | POA: Diagnosis not present

## 2023-11-18 DIAGNOSIS — I129 Hypertensive chronic kidney disease with stage 1 through stage 4 chronic kidney disease, or unspecified chronic kidney disease: Secondary | ICD-10-CM | POA: Diagnosis not present
